# Patient Record
Sex: Male | Born: 1965 | Race: White | Hispanic: No | Marital: Single | Smoking: Former smoker
Health system: Southern US, Community
[De-identification: ages and names within clinical notes are randomized; demographics above are authoritative.]

## PROBLEM LIST (undated history)

## (undated) DIAGNOSIS — I10 Essential (primary) hypertension: Secondary | ICD-10-CM

## (undated) DIAGNOSIS — I219 Acute myocardial infarction, unspecified: Secondary | ICD-10-CM

## (undated) DIAGNOSIS — I2510 Atherosclerotic heart disease of native coronary artery without angina pectoris: Secondary | ICD-10-CM

## (undated) DIAGNOSIS — I509 Heart failure, unspecified: Secondary | ICD-10-CM

## (undated) DIAGNOSIS — E785 Hyperlipidemia, unspecified: Secondary | ICD-10-CM

## (undated) DIAGNOSIS — E669 Obesity, unspecified: Secondary | ICD-10-CM

## (undated) HISTORY — PX: CHOLECYSTECTOMY: SHX55

## (undated) HISTORY — PX: TONSILLECTOMY: SUR1361

## (undated) HISTORY — PX: CORONARY ARTERY BYPASS GRAFT: SHX141

---

## 2020-06-02 ENCOUNTER — Other Ambulatory Visit: Payer: Self-pay

## 2020-06-02 ENCOUNTER — Emergency Department: Payer: Self-pay

## 2020-06-02 ENCOUNTER — Encounter: Payer: Self-pay | Admitting: Emergency Medicine

## 2020-06-02 DIAGNOSIS — Y831 Surgical operation with implant of artificial internal device as the cause of abnormal reaction of the patient, or of later complication, without mention of misadventure at the time of the procedure: Secondary | ICD-10-CM | POA: Diagnosis present

## 2020-06-02 DIAGNOSIS — I445 Left posterior fascicular block: Secondary | ICD-10-CM | POA: Diagnosis present

## 2020-06-02 DIAGNOSIS — Z20822 Contact with and (suspected) exposure to covid-19: Secondary | ICD-10-CM | POA: Diagnosis present

## 2020-06-02 DIAGNOSIS — K2971 Gastritis, unspecified, with bleeding: Secondary | ICD-10-CM | POA: Diagnosis present

## 2020-06-02 DIAGNOSIS — Z8249 Family history of ischemic heart disease and other diseases of the circulatory system: Secondary | ICD-10-CM

## 2020-06-02 DIAGNOSIS — I252 Old myocardial infarction: Secondary | ICD-10-CM

## 2020-06-02 DIAGNOSIS — D509 Iron deficiency anemia, unspecified: Secondary | ICD-10-CM | POA: Diagnosis present

## 2020-06-02 DIAGNOSIS — Z87891 Personal history of nicotine dependence: Secondary | ICD-10-CM

## 2020-06-02 DIAGNOSIS — Z888 Allergy status to other drugs, medicaments and biological substances status: Secondary | ICD-10-CM

## 2020-06-02 DIAGNOSIS — K449 Diaphragmatic hernia without obstruction or gangrene: Secondary | ICD-10-CM | POA: Diagnosis present

## 2020-06-02 DIAGNOSIS — Z6838 Body mass index (BMI) 38.0-38.9, adult: Secondary | ICD-10-CM

## 2020-06-02 DIAGNOSIS — Z951 Presence of aortocoronary bypass graft: Secondary | ICD-10-CM

## 2020-06-02 DIAGNOSIS — E785 Hyperlipidemia, unspecified: Secondary | ICD-10-CM | POA: Diagnosis present

## 2020-06-02 DIAGNOSIS — K219 Gastro-esophageal reflux disease without esophagitis: Secondary | ICD-10-CM | POA: Diagnosis present

## 2020-06-02 DIAGNOSIS — I110 Hypertensive heart disease with heart failure: Secondary | ICD-10-CM | POA: Diagnosis present

## 2020-06-02 DIAGNOSIS — Z7982 Long term (current) use of aspirin: Secondary | ICD-10-CM

## 2020-06-02 DIAGNOSIS — I509 Heart failure, unspecified: Secondary | ICD-10-CM | POA: Diagnosis present

## 2020-06-02 DIAGNOSIS — E1165 Type 2 diabetes mellitus with hyperglycemia: Secondary | ICD-10-CM | POA: Diagnosis present

## 2020-06-02 DIAGNOSIS — K921 Melena: Secondary | ICD-10-CM | POA: Diagnosis present

## 2020-06-02 DIAGNOSIS — T82855A Stenosis of coronary artery stent, initial encounter: Secondary | ICD-10-CM | POA: Diagnosis present

## 2020-06-02 DIAGNOSIS — K279 Peptic ulcer, site unspecified, unspecified as acute or chronic, without hemorrhage or perforation: Secondary | ICD-10-CM | POA: Diagnosis present

## 2020-06-02 DIAGNOSIS — Z9114 Patient's other noncompliance with medication regimen: Secondary | ICD-10-CM

## 2020-06-02 DIAGNOSIS — I25110 Atherosclerotic heart disease of native coronary artery with unstable angina pectoris: Principal | ICD-10-CM | POA: Diagnosis present

## 2020-06-02 DIAGNOSIS — Z7902 Long term (current) use of antithrombotics/antiplatelets: Secondary | ICD-10-CM

## 2020-06-02 DIAGNOSIS — Z79899 Other long term (current) drug therapy: Secondary | ICD-10-CM

## 2020-06-02 LAB — CBC WITH DIFFERENTIAL/PLATELET
Abs Immature Granulocytes: 0.03 10*3/uL (ref 0.00–0.07)
Basophils Absolute: 0 10*3/uL (ref 0.0–0.1)
Basophils Relative: 0 %
Eosinophils Absolute: 0.3 10*3/uL (ref 0.0–0.5)
Eosinophils Relative: 3 %
HCT: 36.2 % — ABNORMAL LOW (ref 39.0–52.0)
Hemoglobin: 10.8 g/dL — ABNORMAL LOW (ref 13.0–17.0)
Immature Granulocytes: 0 %
Lymphocytes Relative: 27 %
Lymphs Abs: 2.3 10*3/uL (ref 0.7–4.0)
MCH: 23.6 pg — ABNORMAL LOW (ref 26.0–34.0)
MCHC: 29.8 g/dL — ABNORMAL LOW (ref 30.0–36.0)
MCV: 79 fL — ABNORMAL LOW (ref 80.0–100.0)
Monocytes Absolute: 0.8 10*3/uL (ref 0.1–1.0)
Monocytes Relative: 9 %
Neutro Abs: 5.1 10*3/uL (ref 1.7–7.7)
Neutrophils Relative %: 61 %
Platelets: 338 10*3/uL (ref 150–400)
RBC: 4.58 MIL/uL (ref 4.22–5.81)
RDW: 18 % — ABNORMAL HIGH (ref 11.5–15.5)
WBC: 8.4 10*3/uL (ref 4.0–10.5)
nRBC: 0 % (ref 0.0–0.2)

## 2020-06-02 NOTE — ED Triage Notes (Signed)
Patient ambulatory to triage with steady gait, without difficulty or distress noted; pt reports PTA began to have left sided CP radiating into jaw and left arm accomp by nausea

## 2020-06-03 ENCOUNTER — Encounter
Admission: EM | Disposition: A | Discharge status: Home / Self Care | Payer: Self-pay | Source: Home / Self Care | Attending: Internal Medicine

## 2020-06-03 ENCOUNTER — Inpatient Hospital Stay
Admission: EM | Admit: 2020-06-03 | Discharge: 2020-06-07 | DRG: 286 | Disposition: A | Discharge status: Home / Self Care | Payer: Self-pay | Attending: Internal Medicine | Admitting: Internal Medicine

## 2020-06-03 ENCOUNTER — Other Ambulatory Visit: Payer: Self-pay | Admitting: Cardiovascular Disease

## 2020-06-03 ENCOUNTER — Encounter: Payer: Self-pay | Admitting: Internal Medicine

## 2020-06-03 DIAGNOSIS — I25110 Atherosclerotic heart disease of native coronary artery with unstable angina pectoris: Principal | ICD-10-CM

## 2020-06-03 DIAGNOSIS — I200 Unstable angina: Secondary | ICD-10-CM

## 2020-06-03 DIAGNOSIS — I509 Heart failure, unspecified: Secondary | ICD-10-CM

## 2020-06-03 DIAGNOSIS — R079 Chest pain, unspecified: Secondary | ICD-10-CM | POA: Diagnosis present

## 2020-06-03 DIAGNOSIS — I10 Essential (primary) hypertension: Secondary | ICD-10-CM

## 2020-06-03 DIAGNOSIS — I2510 Atherosclerotic heart disease of native coronary artery without angina pectoris: Secondary | ICD-10-CM

## 2020-06-03 DIAGNOSIS — R072 Precordial pain: Secondary | ICD-10-CM

## 2020-06-03 HISTORY — DX: Acute myocardial infarction, unspecified: I21.9

## 2020-06-03 HISTORY — PX: THORACIC AORTOGRAM: CATH118269

## 2020-06-03 HISTORY — PX: LEFT HEART CATH AND CORS/GRAFTS ANGIOGRAPHY: CATH118250

## 2020-06-03 HISTORY — DX: Hyperlipidemia, unspecified: E78.5

## 2020-06-03 HISTORY — DX: Heart failure, unspecified: I50.9

## 2020-06-03 HISTORY — DX: Atherosclerotic heart disease of native coronary artery without angina pectoris: I25.10

## 2020-06-03 HISTORY — DX: Essential (primary) hypertension: I10

## 2020-06-03 HISTORY — DX: Obesity, unspecified: E66.9

## 2020-06-03 LAB — D-DIMER, QUANTITATIVE: D-Dimer, Quant: 1.2 ug/mL-FEU — ABNORMAL HIGH (ref 0.00–0.50)

## 2020-06-03 LAB — TROPONIN I (HIGH SENSITIVITY)
Troponin I (High Sensitivity): 15 ng/L (ref <18)
Troponin I (High Sensitivity): 17 ng/L (ref <18)
Troponin I (High Sensitivity): 11 ng/L (ref <18)

## 2020-06-03 LAB — COMPREHENSIVE METABOLIC PANEL
ALT: 13 U/L (ref 0–44)
AST: 17 U/L (ref 15–41)
Albumin: 3.5 g/dL (ref 3.5–5.0)
Alkaline Phosphatase: 73 U/L (ref 38–126)
Anion gap: 11 (ref 5–15)
BUN: 12 mg/dL (ref 6–20)
CO2: 22 mmol/L (ref 22–32)
Calcium: 8.6 mg/dL — ABNORMAL LOW (ref 8.9–10.3)
Chloride: 103 mmol/L (ref 98–111)
Creatinine, Ser: 0.9 mg/dL (ref 0.61–1.24)
GFR, Estimated: >60 mL/min (ref >60)
Glucose, Bld: 214 mg/dL — ABNORMAL HIGH (ref 70–99)
Potassium: 3.7 mmol/L (ref 3.5–5.1)
Sodium: 136 mmol/L (ref 135–145)
Total Bilirubin: 0.4 mg/dL (ref 0.3–1.2)
Total Protein: 7.1 g/dL (ref 6.5–8.1)

## 2020-06-03 LAB — SARS CORONAVIRUS 2 (TAT 6-24 HRS): SARS Coronavirus 2: NEGATIVE

## 2020-06-03 LAB — GLUCOSE, CAPILLARY: Glucose-Capillary: 179 mg/dL — ABNORMAL HIGH (ref 70–99)

## 2020-06-03 LAB — HIV ANTIBODY (ROUTINE TESTING W REFLEX): HIV Screen 4th Generation wRfx: NONREACTIVE

## 2020-06-03 LAB — HEMOGLOBIN A1C
Hgb A1c MFr Bld: 7.9 % — ABNORMAL HIGH (ref 4.8–5.6)
Mean Plasma Glucose: 180 mg/dL

## 2020-06-03 SURGERY — LEFT HEART CATH AND CORS/GRAFTS ANGIOGRAPHY
Anesthesia: Moderate Sedation

## 2020-06-03 MED ORDER — SODIUM CHLORIDE 0.9 % WEIGHT BASED INFUSION
1.0000 mL/kg/h | INTRAVENOUS | Status: DC
  Administered 2020-06-03: 1 mL/kg/h via INTRAVENOUS

## 2020-06-03 MED ORDER — CLOPIDOGREL BISULFATE 75 MG PO TABS
75.0000 mg | ORAL_TABLET | Freq: Every day | ORAL | Status: DC
  Administered 2020-06-03 – 2020-06-07 (×5): 75 mg via ORAL
  Filled 2020-06-03 (×5): qty 1

## 2020-06-03 MED ORDER — ENOXAPARIN SODIUM 60 MG/0.6ML IJ SOSY
0.5000 mg/kg | PREFILLED_SYRINGE | INTRAMUSCULAR | Status: DC
  Filled 2020-06-03: qty 1.2

## 2020-06-03 MED ORDER — HEPARIN (PORCINE) IN NACL 1000-0.9 UT/500ML-% IV SOLN
INTRAVENOUS | Status: AC
  Filled 2020-06-03: qty 1000

## 2020-06-03 MED ORDER — SODIUM CHLORIDE 0.9% FLUSH: 3.0000 mL | INTRAVENOUS | Status: DC | PRN

## 2020-06-03 MED ORDER — MIDAZOLAM HCL 2 MG/2ML IJ SOLN
INTRAMUSCULAR | Status: AC
  Filled 2020-06-03: qty 2

## 2020-06-03 MED ORDER — ASPIRIN EC 325 MG PO TBEC
325.0000 mg | DELAYED_RELEASE_TABLET | Freq: Every day | ORAL | Status: DC
  Administered 2020-06-03: 325 mg via ORAL
  Filled 2020-06-03: qty 1

## 2020-06-03 MED ORDER — NITROGLYCERIN 0.4 MG SL SUBL: 0.4000 mg | SUBLINGUAL_TABLET | SUBLINGUAL | Status: DC | PRN

## 2020-06-03 MED ORDER — FENTANYL CITRATE (PF) 100 MCG/2ML IJ SOLN
INTRAMUSCULAR | Status: DC | PRN
  Administered 2020-06-03 (×3): 25 ug via INTRAVENOUS

## 2020-06-03 MED ORDER — NITROGLYCERIN 2 % TD OINT
1.0000 [in_us] | TOPICAL_OINTMENT | Freq: Four times a day (QID) | TRANSDERMAL | Status: DC
  Administered 2020-06-03: 1 [in_us] via TOPICAL
  Filled 2020-06-03: qty 1

## 2020-06-03 MED ORDER — HYDRALAZINE HCL 20 MG/ML IJ SOLN: 10.0000 mg | INTRAMUSCULAR | Status: AC | PRN

## 2020-06-03 MED ORDER — FUROSEMIDE 10 MG/ML IJ SOLN
20.0000 mg | Freq: Once | INTRAMUSCULAR | Status: AC
  Administered 2020-06-03: 20 mg via INTRAVENOUS
  Filled 2020-06-03: qty 2

## 2020-06-03 MED ORDER — SODIUM CHLORIDE 0.9 % WEIGHT BASED INFUSION
3.0000 mL/kg/h | INTRAVENOUS | Status: DC
  Administered 2020-06-03: 3 mL/kg/h via INTRAVENOUS

## 2020-06-03 MED ORDER — VERAPAMIL HCL 2.5 MG/ML IV SOLN
INTRAVENOUS | Status: AC
  Filled 2020-06-03: qty 2

## 2020-06-03 MED ORDER — LIDOCAINE HCL (PF) 1 % IJ SOLN
INTRAMUSCULAR | Status: AC
  Filled 2020-06-03: qty 30

## 2020-06-03 MED ORDER — ENOXAPARIN SODIUM 60 MG/0.6ML IJ SOSY
0.5000 mg/kg | PREFILLED_SYRINGE | INTRAMUSCULAR | Status: DC
  Filled 2020-06-03 (×2): qty 0.63

## 2020-06-03 MED ORDER — MIDAZOLAM HCL 2 MG/2ML IJ SOLN
INTRAMUSCULAR | Status: DC | PRN
Start: 2020-06-03 — End: 2020-06-03
  Administered 2020-06-03: 2 mg via INTRAVENOUS
  Administered 2020-06-03: 1 mg via INTRAVENOUS

## 2020-06-03 MED ORDER — ASPIRIN 81 MG PO CHEW
81.0000 mg | CHEWABLE_TABLET | Freq: Every day | ORAL | Status: DC
  Administered 2020-06-04 – 2020-06-07 (×4): 81 mg via ORAL
  Filled 2020-06-03 (×4): qty 1

## 2020-06-03 MED ORDER — ENOXAPARIN SODIUM 60 MG/0.6ML IJ SOSY
0.5000 mg/kg | PREFILLED_SYRINGE | INTRAMUSCULAR | Status: DC
  Filled 2020-06-03: qty 0.63

## 2020-06-03 MED ORDER — SODIUM CHLORIDE 0.9 % IV SOLN: 250.0000 mL | INTRAVENOUS | Status: DC | PRN

## 2020-06-03 MED ORDER — ENOXAPARIN SODIUM 80 MG/0.8ML IJ SOSY
62.5000 mg | PREFILLED_SYRINGE | INTRAMUSCULAR | Status: DC
  Administered 2020-06-04: 62.5 mg via SUBCUTANEOUS
  Filled 2020-06-03: qty 0.63
  Filled 2020-06-03: qty 0.8

## 2020-06-03 MED ORDER — MORPHINE SULFATE (PF) 2 MG/ML IV SOLN
INTRAVENOUS | Status: AC
  Filled 2020-06-03: qty 1

## 2020-06-03 MED ORDER — ASPIRIN EC 81 MG PO TBEC: 81.0000 mg | DELAYED_RELEASE_TABLET | Freq: Every day | ORAL | Status: DC

## 2020-06-03 MED ORDER — IOHEXOL 300 MG/ML  SOLN
INTRAMUSCULAR | Status: DC | PRN
  Administered 2020-06-03: 126 mL

## 2020-06-03 MED ORDER — ACETAMINOPHEN 325 MG PO TABS: 650.0000 mg | ORAL_TABLET | ORAL | Status: DC | PRN

## 2020-06-03 MED ORDER — HEPARIN (PORCINE) IN NACL 2000-0.9 UNIT/L-% IV SOLN
INTRAVENOUS | Status: DC | PRN
  Administered 2020-06-03: 1000 mL

## 2020-06-03 MED ORDER — HEPARIN SODIUM (PORCINE) 1000 UNIT/ML IJ SOLN
INTRAMUSCULAR | Status: AC
  Filled 2020-06-03: qty 1

## 2020-06-03 MED ORDER — ASPIRIN 81 MG PO CHEW
324.0000 mg | CHEWABLE_TABLET | Freq: Once | ORAL | Status: AC
  Administered 2020-06-03: 324 mg via ORAL
  Filled 2020-06-03: qty 4

## 2020-06-03 MED ORDER — MORPHINE SULFATE (PF) 2 MG/ML IV SOLN
2.0000 mg | INTRAVENOUS | Status: DC | PRN
Start: 2020-06-03 — End: 2020-06-05
  Administered 2020-06-03 – 2020-06-05 (×15): 2 mg via INTRAVENOUS
  Filled 2020-06-03 (×15): qty 1

## 2020-06-03 MED ORDER — SODIUM CHLORIDE 0.9% FLUSH
3.0000 mL | Freq: Two times a day (BID) | INTRAVENOUS | Status: DC
  Administered 2020-06-03 – 2020-06-06 (×7): 3 mL via INTRAVENOUS

## 2020-06-03 MED ORDER — INSULIN ASPART 100 UNIT/ML IJ SOLN: 0.0000 [IU] | Freq: Every day | INTRAMUSCULAR | Status: DC

## 2020-06-03 MED ORDER — INSULIN ASPART 100 UNIT/ML IJ SOLN
0.0000 [IU] | Freq: Three times a day (TID) | INTRAMUSCULAR | Status: DC
  Administered 2020-06-04: 5 [IU] via SUBCUTANEOUS
  Administered 2020-06-04: 3 [IU] via SUBCUTANEOUS
  Administered 2020-06-05: 5 [IU] via SUBCUTANEOUS
  Administered 2020-06-05 (×2): 3 [IU] via SUBCUTANEOUS
  Administered 2020-06-06 (×3): 2 [IU] via SUBCUTANEOUS
  Administered 2020-06-07: 3 [IU] via SUBCUTANEOUS
  Administered 2020-06-07: 2 [IU] via SUBCUTANEOUS
  Filled 2020-06-03 (×10): qty 1

## 2020-06-03 MED ORDER — METOPROLOL TARTRATE 25 MG PO TABS: 25.0000 mg | ORAL_TABLET | Freq: Two times a day (BID) | ORAL | Status: DC

## 2020-06-03 MED ORDER — NITROGLYCERIN 2 % TD OINT
1.0000 [in_us] | TOPICAL_OINTMENT | Freq: Once | TRANSDERMAL | Status: AC
  Administered 2020-06-03: 1 [in_us] via TOPICAL
  Filled 2020-06-03: qty 1

## 2020-06-03 MED ORDER — LIDOCAINE HCL (PF) 1 % IJ SOLN
INTRAMUSCULAR | Status: DC | PRN
  Administered 2020-06-03: 10 mL

## 2020-06-03 MED ORDER — FENTANYL CITRATE (PF) 100 MCG/2ML IJ SOLN
INTRAMUSCULAR | Status: AC
  Filled 2020-06-03: qty 2

## 2020-06-03 MED ORDER — SODIUM CHLORIDE 0.9% FLUSH
3.0000 mL | Freq: Two times a day (BID) | INTRAVENOUS | Status: DC
  Administered 2020-06-03 – 2020-06-05 (×6): 3 mL via INTRAVENOUS

## 2020-06-03 MED ORDER — MORPHINE SULFATE (PF) 4 MG/ML IV SOLN
4.0000 mg | Freq: Once | INTRAVENOUS | Status: AC
Start: 2020-06-03 — End: 2020-06-03
  Administered 2020-06-03: 4 mg via INTRAVENOUS
  Filled 2020-06-03: qty 1

## 2020-06-03 MED ORDER — LABETALOL HCL 5 MG/ML IV SOLN: 10.0000 mg | INTRAVENOUS | Status: AC | PRN

## 2020-06-03 MED ORDER — ONDANSETRON HCL 4 MG/2ML IJ SOLN
4.0000 mg | Freq: Four times a day (QID) | INTRAMUSCULAR | Status: DC | PRN
  Administered 2020-06-04 – 2020-06-07 (×3): 4 mg via INTRAVENOUS
  Filled 2020-06-03 (×3): qty 2

## 2020-06-03 MED ORDER — LISINOPRIL 10 MG PO TABS
10.0000 mg | ORAL_TABLET | Freq: Every day | ORAL | Status: DC
  Administered 2020-06-04: 10 mg via ORAL
  Filled 2020-06-03: qty 1

## 2020-06-03 MED ORDER — ATORVASTATIN CALCIUM 20 MG PO TABS: 40.0000 mg | ORAL_TABLET | Freq: Every day | ORAL | Status: DC

## 2020-06-03 MED ORDER — PANTOPRAZOLE SODIUM 40 MG PO TBEC
40.0000 mg | DELAYED_RELEASE_TABLET | Freq: Every day | ORAL | Status: DC
  Administered 2020-06-03 – 2020-06-04 (×2): 40 mg via ORAL
  Filled 2020-06-03 (×2): qty 1

## 2020-06-03 MED ORDER — RANOLAZINE ER 500 MG PO TB12
1000.0000 mg | ORAL_TABLET | Freq: Two times a day (BID) | ORAL | Status: DC
  Administered 2020-06-03 – 2020-06-07 (×9): 1000 mg via ORAL
  Filled 2020-06-03 (×9): qty 2

## 2020-06-03 MED ORDER — METOPROLOL SUCCINATE ER 25 MG PO TB24
25.0000 mg | ORAL_TABLET | Freq: Two times a day (BID) | ORAL | Status: DC
  Administered 2020-06-03 – 2020-06-07 (×9): 25 mg via ORAL
  Filled 2020-06-03 (×9): qty 1

## 2020-06-03 MED ORDER — ONDANSETRON HCL 4 MG/2ML IJ SOLN
4.0000 mg | Freq: Once | INTRAMUSCULAR | Status: AC
  Administered 2020-06-03: 4 mg via INTRAVENOUS
  Filled 2020-06-03: qty 2

## 2020-06-03 MED ORDER — RANOLAZINE ER 500 MG PO TB12
500.0000 mg | ORAL_TABLET | Freq: Two times a day (BID) | ORAL | Status: DC
  Administered 2020-06-03: 500 mg via ORAL
  Filled 2020-06-03 (×2): qty 1

## 2020-06-03 SURGICAL SUPPLY — 18 items
CANNULA 5F STIFF (CANNULA) ×1 IMPLANT
CATH INFINITI 5 FR IM (CATHETERS) ×1 IMPLANT
CATH INFINITI 5FR AL1 (CATHETERS) ×1 IMPLANT
CATH INFINITI 5FR ANG PIGTAIL (CATHETERS) ×1 IMPLANT
CATH INFINITI 5FR JL4 (CATHETERS) ×1 IMPLANT
DEVICE CLOSURE MYNXGRIP 5F (Vascular Products) ×1 IMPLANT
DRAPE BRACHIAL (DRAPES) ×1 IMPLANT
GLIDESHEATH SLEND A-KIT 6F 22G (SHEATH) ×1 IMPLANT
GUIDEWIRE INQWIRE 1.5J.035X260 (WIRE) IMPLANT
INQWIRE 1.5J .035X260CM (WIRE) ×2
PACK CARDIAC CATH (CUSTOM PROCEDURE TRAY) ×2 IMPLANT
PANNUS RETENTION SYSTEM 2 PAD (MISCELLANEOUS) ×1 IMPLANT
PROTECTION STATION PRESSURIZED (MISCELLANEOUS) ×2
SET ATX SIMPLICITY (MISCELLANEOUS) ×1 IMPLANT
SHEATH AVANTI 5FR X 11CM (SHEATH) ×1 IMPLANT
STATION PROTECTION PRESSURIZED (MISCELLANEOUS) IMPLANT
WIRE AMPLATZ SS-J .035X180CM (WIRE) ×1 IMPLANT
WIRE GUIDERIGHT .035X150 (WIRE) ×1 IMPLANT

## 2020-06-03 NOTE — ED Provider Notes (Signed)
[facility] Emergency Department Provider Note   ____________________________________________   Event Date/Time   First MD Initiated Contact with Patient 06/03/20 [phone]     (approximate)  I have reviewed the triage vital signs and the nursing notes.   HISTORY  Chief Complaint Chest Pain    HPI Richard Malone is a 55 y.o. male who presents to the ED with a chief complaint of chest pain.  Patient has a history of CAD status post CABG, 16 stents.  Began to experience left-sided chest pressure approximately 10 PM after being picked up from the airport from flight from [place].  Took nitroglycerin without relief in symptoms.  Denies associated diaphoresis, shortness of breath, vomiting or dizziness.  Endorses nausea and pain radiating into his left jaw and arm.  Denies abdominal pain, dysuria or diarrhea.  States feels similarly to his anginal pain.     Past Medical History:  Diagnosis Date  . CHF (congestive heart failure) (HCC)   . MI (myocardial infarction) [facility])     Patient Active Problem List   Diagnosis Date Noted  . Chest pain 06/03/2020  . CAD (coronary artery disease) 06/03/2020  . CHF (congestive heart failure) (HCC) 06/03/2020    Past Surgical History:  Procedure Laterality Date  . CHOLECYSTECTOMY    . TONSILLECTOMY      Prior to Admission medications   Not on File    Allergies Patient has no known allergies.  History reviewed. No pertinent family history.  Social History Social History   Tobacco Use  . Smoking status: Never Smoker  . Smokeless tobacco: Never Used  Vaping Use  . Vaping Use: Never used  Substance Use Topics  . Alcohol use: Not Currently    Review of Systems  Constitutional: No fever/chills Eyes: No visual changes. ENT: No sore throat. Cardiovascular: Positive for chest pain. Respiratory: Denies shortness of breath. Gastrointestinal: No abdominal pain.  Positive for nausea, no vomiting.  No diarrhea.   No constipation. Genitourinary: Negative for dysuria. Musculoskeletal: Negative for back pain. Skin: Negative for rash. Neurological: Negative for headaches, focal weakness or numbness.   ____________________________________________   PHYSICAL EXAM:  VITAL SIGNS: ED Triage Vitals  Enc Vitals Group     BP 06/02/20 2238 (!) 166/106     Pulse Rate 06/02/20 2238 69     Resp 06/02/20 2238 18     Temp 06/02/20 2238 98.1 F (36.7 C)     Temp Source 06/02/20 2238 Oral     SpO2 06/02/20 2238 100 %     Weight 06/02/20 2237 270 lb (122.5 kg)     Height 06/02/20 2237 5\' 10"  (1.778 m)     Head Circumference --      Peak Flow --      Pain Score 06/02/20 2237 8     Pain Loc --      Pain Edu? --      Excl. in GC? --     Constitutional: Alert and oriented. Well appearing and in mild acute distress. Eyes: Conjunctivae are normal. PERRL. EOMI. Head: Atraumatic. Nose: No congestion/rhinnorhea. Mouth/Throat: Mucous membranes are moist.   Neck: No stridor.   Cardiovascular: Normal rate, regular rhythm. Grossly normal heart sounds.  Good peripheral circulation. Respiratory: Normal respiratory effort.  No retractions. Lungs CTAB. Gastrointestinal: Soft and nontender to light or deep palpation. No distention. No abdominal bruits. No CVA tenderness. Musculoskeletal: No lower extremity tenderness nor edema.  No joint effusions. Neurologic:  Normal speech and language. No  gross focal neurologic deficits are appreciated. No gait instability. Skin:  Skin is warm, dry and intact. No rash noted. Psychiatric: Mood and affect are normal. Speech and behavior are normal.  ____________________________________________   LABS (all labs ordered are listed, but only abnormal results are displayed)  Labs Reviewed  CBC WITH DIFFERENTIAL/PLATELET - Abnormal; Notable for the following components:      Result Value   Hemoglobin 10.8 (*)    HCT 36.2 (*)    MCV 79.0 (*)    MCH 23.6 (*)    MCHC 29.8 (*)     RDW 18.0 (*)    All other components within normal limits  COMPREHENSIVE METABOLIC PANEL - Abnormal; Notable for the following components:   Glucose, Bld 214 (*)    Calcium 8.6 (*)    All other components within normal limits  HIV ANTIBODY (ROUTINE TESTING W REFLEX)  CBC  CREATININE, SERUM  TROPONIN I (HIGH SENSITIVITY)  TROPONIN I (HIGH SENSITIVITY)   ____________________________________________  EKG  ED ECG REPORT I, Bridgitte Felicetti J, the attending physician, personally viewed and interpreted this ECG.   Date: 06/03/2020  EKG Time: 2239  Rate: 71  Rhythm: normal EKG, normal sinus rhythm  Axis: Normal  Intervals:none  ST&T Change: Nonspecific  ED ECG REPORT I, Jenascia Bumpass J, the attending physician, personally viewed and interpreted this ECG.   Date: 06/03/2020  EKG Time: 0420  Rate: 71  Rhythm: normal EKG, normal sinus rhythm  Axis: Normal  Intervals:none  ST&T Change: Nonspecific   ____________________________________________  RADIOLOGY I, Gracen Ringwald J, personally viewed and evaluated these images (plain radiographs) as part of my medical decision making, as well as reviewing the written report by the radiologist.  ED MD interpretation: No acute cardiopulmonary process  Official radiology report(s): DG Chest 2 View  Result Date: 06/02/2020 CLINICAL DATA:  55 year old male with chest pain. EXAM: CHEST - 2 VIEW COMPARISON:  None. FINDINGS: No focal consolidation, pleural effusion, or pneumothorax. Mild cardiomegaly. Median sternotomy wires and coronary stent. No acute osseous pathology. Several metallic pellets noted over the right shoulder. IMPRESSION: No acute cardiopulmonary process. Electronically Signed   By: Elgie Collard M.D.   On: 06/02/2020 22:58    ____________________________________________   PROCEDURES  Procedure(s) performed (including Critical Care):  .1-3 Lead EKG Interpretation Performed by: Irean Hong, MD Authorized by: Irean Hong, MD      Interpretation: normal     ECG rate:  75   ECG rate assessment: normal     Rhythm: sinus rhythm     Ectopy: none     Conduction: normal   Comments:     Patient placed on cardiac monitor to evaluate for arrhythmias    CRITICAL CARE Performed by: Irean Hong   Total critical care time: 30 minutes  Critical care time was exclusive of separately billable procedures and treating other patients.  Critical care was necessary to treat or prevent imminent or life-threatening deterioration.  Critical care was time spent personally by me on the following activities: development of treatment plan with patient and/or surrogate as well as nursing, discussions with consultants, evaluation of patient's response to treatment, examination of patient, obtaining history from patient or surrogate, ordering and performing treatments and interventions, ordering and review of laboratory studies, ordering and review of radiographic studies, pulse oximetry and re-evaluation of patient's condition. ____________________________________________   INITIAL IMPRESSION / ASSESSMENT AND PLAN / ED COURSE  As part of my medical decision making, I reviewed the following data within the electronic  MEDICAL RECORD NUMBER Nursing notes reviewed and incorporated, Labs reviewed, EKG interpreted, Old chart reviewed (none available), Radiograph reviewed, Discussed with admitting physician and Notes from prior ED visits     55 year old male with extensive history of CAD presenting with chest pain. Differential diagnosis includes, but is not limited to, ACS, aortic dissection, pulmonary embolism, cardiac tamponade, pneumothorax, pneumonia, pericarditis, myocarditis, GI-related causes including esophagitis/gastritis, and musculoskeletal chest wall pain.    Despite 2 sets of negative troponins, patient persists with active chest pain.  Repeat EKG unremarkable.  Will initiate treatment with aspirin, morphine, nitroglycerin paste.   Will discuss with hospital services for admission for unstable angina and hypertension.      ____________________________________________   FINAL CLINICAL IMPRESSION(S) / ED DIAGNOSES  Final diagnoses:  Unstable angina (HCC)  Hypertension, unspecified type     ED Discharge Orders    None      *Please note:  Keaghan Staton was evaluated in Emergency Department on 06/03/2020 for the symptoms described in the history of present illness. He was evaluated in the context of the global COVID-19 pandemic, which necessitated consideration that the patient might be at risk for infection with the SARS-CoV-2 virus that causes COVID-19. Institutional protocols and algorithms that pertain to the evaluation of patients at risk for COVID-19 are in a state of rapid change based on information released by regulatory bodies including the CDC and federal and state organizations. These policies and algorithms were followed during the patient's care in the ED.  Some ED evaluations and interventions may be delayed as a result of limited staffing during and the pandemic.*   Note:  This document was prepared using Dragon voice recognition software and may include unintentional dictation errors.   Irean Hong, MD 06/03/20 [phone]

## 2020-06-03 NOTE — H&P (View-Only) (Signed)
Cardiology Consult    Patient ID: Richard Malone MRN: [id number], DOB/AGE: 1965-10-11   Admit date: 06/03/2020 Date of Consult: 06/03/2020  Primary Physician: System, Provider Not In Primary Cardiologist: None - lives  Requesting Provider: Kerry Hough, MD.  Patient Profile    Richard Malone is a 55 y.o. male with a history of obesity, MI, HTN, CAD, and hyperlipidemia, who is being seen today for the evaluation of chest pain at the request of Dr. Kerry Malone.  Past Medical History   Past Medical History:  Diagnosis Date  . CAD (coronary artery disease)    a. s/p first MI @ age 31; b. 2004 s/p CABG @ [facility]; c. reports h/o 16 stents; d. last cath ~ 4 yrs ago in [place].  Marland Kitchen HTN (hypertension)   . Hyperlipidemia LDL goal <70    a. Statin/PCSK9i intolerant. Does not want to try alternate agents.  . MI (myocardial infarction) (HCC)   . Obesity     Past Surgical History:  Procedure Laterality Date  . CHOLECYSTECTOMY    . CORONARY ARTERY BYPASS GRAFT    . TONSILLECTOMY       Allergies  Allergies  Allergen Reactions  . Drug Ingredient [Atorvastatin]     History of Present Illness    55 year old male with a prior history of obesity, hyperlipidemia, HTN, MI and CAD. He has a significant family history of premature CAD, as well.  His first MI occurred at age 77.  He underwent CABG in ~ 2004 and has had a total of 16 stents placed.  He thinks that his last cath was about 4 yrs ago, "maybe somewhere in [place]."  He works as a Control and instrumentation engineer and has not been able to establish a cardiologist to routinely follow up as he travels around the country often due to his job.  At baseline, he does not typically experience chest pain or dyspnea.  He is relatively sedentary.  He does carry sl NTG w/ him but rarely uses - last dose 10-12 months ago.  He was in his USOH on 5/25.  He traveled from [place] to [place], [place] via plane and then drove from [place] to [place] w/ his girlfriend, to visit friends.   He says that they stopped and walked around frequently.  About 20 mins outside of [place], he became diaphoretic and began to experience chest pain/tightness, radiating to the jaw and left arm with dyspnea, nausea and vomiting. Patient states he has experienced these symptoms before with previous MIs.  He took sl ntg en route w/o relief, prompting him to present to the ED.  Here, symptoms eventually resolved following asa, morphine, and ntp, though total duration of symptoms was ~ 6 hrs.  Despite prolonged symptoms, ECG is w/o acute ST/T changes and troponins have remained normal.  He did have recurrent c/p earlier this AM, which quickly resolved after additional doses of morphine.  Inpatient Medications    . aspirin EC  325 mg Oral Daily  . clopidogrel  75 mg Oral Daily  . enoxaparin (LOVENOX) injection  0.5 mg/kg Subcutaneous Q24H  . insulin aspart  0-15 Units Subcutaneous TID WC  . insulin aspart  0-5 Units Subcutaneous QHS  . lisinopril  10 mg Oral Daily  . metoprolol succinate  25 mg Oral BID  . nitroGLYCERIN  1 inch Topical Q6H WA  . pantoprazole  40 mg Oral Daily  . ranolazine  500 mg Oral BID  . sodium chloride flush  3 mL Intravenous Q12H  Family History    Family History  Problem Relation Age of Onset  . CAD Mother        premature CAD  . CAD Father        premature CAD  . CAD Sister   . Cancer Sister    He indicated that his mother is deceased. He indicated that his father is deceased. He indicated that his sister is alive. He indicated that his maternal grandmother is deceased. He indicated that his maternal grandfather is deceased. He indicated that his paternal grandmother is deceased. He indicated that his paternal grandfather is deceased.   Social History    Social History   Socioeconomic History  . Marital status: Single    Spouse name: Not on file  . Number of children: Not on file  . Years of education: Not on file  . Highest education level: Not on  file  Occupational History  . Not on file  Tobacco Use  . Smoking status: Former Smoker    Packs/day: 1.00    Years: 15.00    Pack years: 15.00    Types: Cigarettes  . Smokeless tobacco: Never Used  Vaping Use  . Vaping Use: Never used  Substance and Sexual Activity  . Alcohol use: Not Currently  . Drug use: Never  . Sexual activity: Not on file  Other Topics Concern  . 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  Usually spends his [place] in [place].   Social Determinants of Health   Financial Resource Strain: Not on file  Food Insecurity: Not on file  Transportation Needs: Not on file  Physical Activity: Not on file  Stress: Not on file  Social Connections: Not on file  Intimate Partner Violence: Not on file     Review of Systems    General:  No chills, fever, night sweats or weight changes.  Cardiovascular:  +++ 5/10 chest pain, +++ dyspnea last night, no edema, orthopnea, palpitations, paroxysmal nocturnal dyspnea. Dermatological: No rash, lesions/masses Respiratory: No cough, +++ dyspnea w/ chest pain last night. Urologic: No hematuria, dysuria Abdominal:   +++ nausea and vomiting prior to presentation, no diarrhea, bright red blood per rectum, melena, or hematemesis. Neurologic:  No visual changes, wkns, changes in mental status. All other systems reviewed and are otherwise negative except as noted above.  Physical Exam    Blood pressure 119/75, pulse 74, temperature 98.1 F (36.7 C), temperature source Oral, resp. rate 20, height 5\' 10"  (1.778 m), weight 122.5 kg, SpO2 95 %.  General: Pleasant, NAD Psych: Normal affect. Neuro: Alert and oriented X 3. Moves all extremities spontaneously. HEENT: Normal  Neck: Supple, obese - difficult to gauge JVP.  No bruits. Lungs:  Resp regular and unlabored, CTA. Heart:  RRR no s3, s4, or murmurs.  Abdomen: Obese, non-tender, BS + x 4.  Extremities: No clubbing, cyanosis or edema. DP/PT2+, Radials 2+ and equal bilaterally.   Labs    Cardiac Enzymes Recent Labs  Lab 06/02/20 2242 06/03/20 0137 06/03/20 0954  TROPONINIHS 15 17 11       Lab Results  Component Value Date   WBC 8.4 06/02/2020   HGB 10.8 (L) 06/02/2020   HCT 36.2 (L) 06/02/2020   MCV 79.0 (L) 06/02/2020   PLT 338 06/02/2020    Recent Labs  Lab 06/02/20 2242  NA 136  K 3.7  CL 103  CO2 22  BUN 12  CREATININE 0.90  CALCIUM 8.6*  PROT 7.1  BILITOT 0.4  ALKPHOS 73  ALT 13  AST 17  GLUCOSE 214*   Lab Results  Component Value Date   DDIMER 1.20 (H) 06/03/2020     Radiology Studies    DG Chest 2 View  Result Date: 06/02/2020 CLINICAL DATA:  55 year old male with chest pain. EXAM: CHEST - 2 VIEW COMPARISON:  None. FINDINGS: No focal consolidation, pleural effusion, or pneumothorax. Mild cardiomegaly. Median sternotomy wires and coronary stent. No acute osseous pathology. Several metallic pellets noted over the right shoulder. IMPRESSION: No acute cardiopulmonary process. Electronically Signed   By: Elgie Collard M.D.   On: 06/02/2020 22:58    ECG & Cardiac Imaging    SR @ 72 RAD, LPFB, LAE, ? Prior anterior infarct - personally reviewed.  Assessment & Plan    1. Unstable Angina/CAD: 55 y/o w/ a h/o premature CAD s/p first MI @ 50, CABG in 2004, and a reported total of 17 stents in his lifetime.  Last cath ~ 4 yrs ago, possibly in [place]. He doesn't have any stent cards w/ him.  He currently lives in [place], but travels significantly for work, and does not have a regular cardiologist.  He presented 5/25 w/ chest pain, dyspnea, n, and vomiting, after driving for ~ 5 hrs.  He noted that pain was similar to prior angina.  Here, High-sensitivity troponins have been nl despite prolonged symptoms, and EKG does not show any acute ST/T changes (no old EKGs for comparison).  Pain  initially resolved after ~ 6.5 hrs w/ asa, ntp, and morphine, though he has had some recurrence requiring additional doses of morphine.  Pt looked comfortable when interviewed.  Given patient's symptoms, history, and high pretest probability for recurrent obstructive coronary disease, we will plan on cath today.  The patient understands that risks include but are not limited to stroke (1 in 1000), death (1 in 1000), kidney failure [usually temporary] (1 in 500), bleeding (1 in 200), allergic reaction [possibly serious] (1 in 200), and agrees to proceed.  Cont asa, plavix,  blocker, and ranexa.  He is intolerant to statins and also reports myalgias w/ PCSK9i in the past.  He is not willing to try zetia or bempedoic acid.  If cath unrevealing, would have a low threshold to r/o PE given prolonged travel and elevated d dimer.  He is not hypoxic/tachycardic, and denies leg pain.  No swelling noted on exam.  2. Essential HTN: stable on  blocker and acei.  3.  HL:  Statin intolerant.  Says he had myalgias w/ PCSK9i.  Doesn't want to try zeta/bempedoic acid.  Defer mgmt to outpt care team in [facility].  Might be a good candidate for inclisiran.    4.  Microcytic anemia:  Pt reports h/o ulcers dating back to his teens.  He has been on asa/plavix chronically.  Follow and consider GI eval if cath unremarkable.  Cont PPI.  Signed, Nicolasa Ducking, NP 06/03/2020, 12:31 PM  For questions or updates, please contact   Please consult [email or web address] for contact info under Cardiology/STEMI.

## 2020-06-03 NOTE — Interval H&P Note (Signed)
History and Physical Interval Note:  06/03/2020 3:35 PM  Richard Malone  has presented today for surgery, with the diagnosis of unstable angina.  The various methods of treatment have been discussed with the patient and family. After consideration of risks, benefits and other options for treatment, the patient has consented to  Procedure(s): LEFT HEART CATH AND CORS/GRAFTS ANGIOGRAPHY (N/A) as a surgical intervention.  The patient's history has been reviewed, patient examined, no change in status, stable for surgery.  I have reviewed the patient's chart and labs.  Questions were answered to the patient's satisfaction.    Cath Lab Visit (complete for each Cath Lab visit)  Clinical Evaluation Leading to the Procedure:   ACS: Yes.    Non-ACS:  N/A  Richard Malone

## 2020-06-03 NOTE — ED Notes (Signed)
Lab at bedside

## 2020-06-03 NOTE — ED Notes (Addendum)
Informed MD Memon that upon this RN assuming care, this RN noticed that pt had eaten breakfast tray this AM. This RN noted NPO orders in place at this time so MD was made aware. Pt also refusing CBG check at this time. MD also made aware.   Per MD Memon, aware of pt eating, was before cardiology saw pt and is now NPO.

## 2020-06-03 NOTE — Progress Notes (Signed)
Patient admitted to the hospital earlier this morning by Dr. Para March  Patient seen and examined.  Currently is not having any chest pain.  Feels that the nitroglycerin has helped with this.  He was previously having pain that was rating into his jaw and his left arm.  Felt it was similar to prior cardiac pain that he has had in the past.  Cardiac enzymes have been negative.  EKG did not show any acute changes.  He does have a prior history of coronary artery disease with stenting and CABG in the past.  He is chronically on aspirin, Plavix, Ranexa, beta-blocker.  All these medications have been continued.  We will continue Nitropaste for now.  Appreciate cardiology assistance.  Plan is for cardiac catheterization as schedule permits.  D-dimer is also been ordered.  He was noted to be hyperglycemic on arrival with a blood sugar of 214.  A1c has been ordered.  He does not appear to be on any chronic diabetic medications.  We will continue to follow blood sugars.  He does have elevated blood pressure and is currently on metoprolol and lisinopril.  These have been continued.  [employer]

## 2020-06-03 NOTE — Progress Notes (Signed)
Anticoagulation monitoring(Lovenox):  55 yo male ordered Lovenox 40 mg Q24h  Filed Weights   06/02/20 2237  Weight: 122.5 kg (270 lb)   BMI 38.74    Lab Results  Component Value Date   CREATININE 0.90 06/02/2020   Estimated Creatinine Clearance: 124.6 mL/min (by C-G formula based on SCr of 0.9 mg/dL). Hemoglobin & Hematocrit     Component Value Date/Time   HGB 10.8 (L) 06/02/2020 2242   HCT 36.2 (L) 06/02/2020 2242     Per Protocol for Patient with estCrcl > 30 ml/min and BMI > 30, will transition to Lovenox 62.5 mg Q24h.

## 2020-06-03 NOTE — ED Notes (Signed)
Lab requested to recollect d-dimer and HIV orders.

## 2020-06-03 NOTE — H&P (Signed)
History and Physical    Richard Malone YOV:[id number] DOB: 1965-06-12 DOA: 06/03/2020  PCP: No primary care provider on file.   Patient coming from: Home  I have personally briefly reviewed patient's old medical records in [facility]  Chief Complaint: Chest pain  HPI: Richard Malone is a 55 y.o. male from [place] town with medical history significant for  hx of multiple MI s/p CABG and with multiple stents as well as CHF who presents to the ED with left-sided chest pain radiating to jaw and left arm associated with diaphoresis, nausea and vomiting, similar to prior MIs. Pain is severe, described as tightness, onset while at rest.  Denies palpitations, lightheadedness or shortness of breath and denies cough, fever, chills, abdominal pain or change in bowel habits ED course: On arrival, afebrile, BP 166/106, pulse 69 respirations 18 with O2 sat 100% on room air.  Troponin 15-17.  Blood sugar 214, hemoglobin 10.8.  Labs otherwise unremarkable EKG, personally viewed and interpreted: Normal sinus rhythm at 71 with left posterior fascicular block and otherwise nonacute findings Chest x-ray: No acute process  Patient treated with aspirin, Nitropaste, Zofran and morphine.  Hospitalist consulted for admission.  Review of Systems: As per HPI otherwise all other systems on review of systems negative.    Past Medical History:  Diagnosis Date  . CHF (congestive heart failure) (HCC)   . HTN (hypertension)   . MI (myocardial infarction) [facility])     Past Surgical History:  Procedure Laterality Date  . CHOLECYSTECTOMY    . CORONARY ARTERY BYPASS GRAFT    . TONSILLECTOMY       reports that he has quit smoking. He has never used smokeless tobacco. He reports previous alcohol use. No history on file for drug use.  No Known Allergies  Family History  Problem Relation Age of Onset  . CAD Mother   . CAD Father       Prior to Admission medications   Not on File    Physical Exam: Vitals:    06/02/20 2237 06/02/20 2238 06/03/20 0128 06/03/20 0400  BP:  (!) 166/106 (!) 163/93 (!) 157/110  Pulse:  69 70 71  Resp:  18 18 16   Temp:  98.1 F (36.7 C) 98.1 F (36.7 C)   TempSrc:  Oral Oral   SpO2:  100% 100% 99%  Weight: 122.5 kg     Height: 5\' 10"  (1.778 m)        Vitals:   06/02/20 2237 06/02/20 2238 06/03/20 0128 06/03/20 0400  BP:  (!) 166/106 (!) 163/93 (!) 157/110  Pulse:  69 70 71  Resp:  18 18 16   Temp:  98.1 F (36.7 C) 98.1 F (36.7 C)   TempSrc:  Oral Oral   SpO2:  100% 100% 99%  Weight: 122.5 kg     Height: 5\' 10"  (1.778 m)         Constitutional: Alert and oriented x 3 . Not in any apparent distress HEENT:      Head: Normocephalic and atraumatic.         Eyes: PERLA, EOMI, Conjunctivae are normal. Sclera is non-icteric.       Mouth/Throat: Mucous membranes are moist.       Neck: Supple with no signs of meningismus. Cardiovascular: Regular rate and rhythm. No murmurs, gallops, or rubs. 2+ symmetrical distal pulses are present . No JVD. No LE edema Respiratory: Respiratory effort normal .Lungs sounds clear bilaterally. No wheezes, crackles, or rhonchi.  Gastrointestinal:  Soft, non tender, and non distended with positive bowel sounds.  Genitourinary: No CVA tenderness. Musculoskeletal: Nontender with normal range of motion in all extremities. No cyanosis, or erythema of extremities. Neurologic:  Face is symmetric. Moving all extremities. No gross focal neurologic deficits . Skin: Skin is warm, dry.  No rash or ulcers Psychiatric: Mood and affect are normal    Labs on Admission: I have personally reviewed following labs and imaging studies  CBC: Recent Labs  Lab 06/02/20 2242  WBC 8.4  NEUTROABS 5.1  HGB 10.8*  HCT 36.2*  MCV 79.0*  PLT 338   Basic Metabolic Panel: Recent Labs  Lab 06/02/20 2242  NA 136  K 3.7  CL 103  CO2 22  GLUCOSE 214*  BUN 12  CREATININE 0.90  CALCIUM 8.6*   GFR: Estimated Creatinine Clearance: 124.6  mL/min (by C-G formula based on SCr of 0.9 mg/dL). Liver Function Tests: Recent Labs  Lab 06/02/20 2242  AST 17  ALT 13  ALKPHOS 73  BILITOT 0.4  PROT 7.1  ALBUMIN 3.5   No results for input(s): LIPASE, AMYLASE in the last 168 hours. No results for input(s): AMMONIA in the last 168 hours. Coagulation Profile: No results for input(s): INR, PROTIME in the last 168 hours. Cardiac Enzymes: No results for input(s): CKTOTAL, CKMB, CKMBINDEX, TROPONINI in the last 168 hours. BNP (last 3 results) No results for input(s): PROBNP in the last 8760 hours. HbA1C: No results for input(s): HGBA1C in the last 72 hours. CBG: No results for input(s): GLUCAP in the last 168 hours. Lipid Profile: No results for input(s): CHOL, HDL, LDLCALC, TRIG, CHOLHDL, LDLDIRECT in the last 72 hours. Thyroid Function Tests: No results for input(s): TSH, T4TOTAL, FREET4, T3FREE, THYROIDAB in the last 72 hours. Anemia Panel: No results for input(s): VITAMINB12, FOLATE, FERRITIN, TIBC, IRON, RETICCTPCT in the last 72 hours. Urine analysis: No results found for: COLORURINE, APPEARANCEUR, LABSPEC, PHURINE, GLUCOSEU, HGBUR, BILIRUBINUR, KETONESUR, PROTEINUR, UROBILINOGEN, NITRITE, LEUKOCYTESUR  Radiological Exams on Admission: DG Chest 2 View  Result Date: 06/02/2020 CLINICAL DATA:  55 year old male with chest pain. EXAM: CHEST - 2 VIEW COMPARISON:  None. FINDINGS: No focal consolidation, pleural effusion, or pneumothorax. Mild cardiomegaly. Median sternotomy wires and coronary stent. No acute osseous pathology. Several metallic pellets noted over the right shoulder. IMPRESSION: No acute cardiopulmonary process. Electronically Signed   By: Elgie Collard M.D.   On: 06/02/2020 22:58     Assessment/Plan 55 year old male from [place] with history of CAD s/p MIs, CABG with multiple stents, CHF, HTN presenting with typical chest pain similar to prior MIs    Chest pain   CAD with hx of MI, CABG and stent  angioplasty - Patient with typical chest pain but with troponin 15-17 and EKG nonacute - Continue aspirin, atorvastatin, metoprolol, nitropaste with morphine for breakthrough - Cardiology consult - Past records unavailable in Care Everywhere  Elevated blood sugar - Blood sugar 214 - Follow A1c to screen for diabetes  Hypertension - Continue metoprolol pending med rec    CHF (congestive heart failure) (HCC) - Not acutely exacerbated, chest x-ray clear - Continue meds as above    DVT prophylaxis: Lovenox  Code Status: full code  Family Communication:  none  Disposition Plan: Back to previous home environment Consults called: Cardiology Status: Observation    Andris Baumann MD [facility]     06/03/2020, 4:39 AM

## 2020-06-03 NOTE — Consult Note (Addendum)
Cardiology Consult    Patient ID: Richard Malone MRN: [id number], DOB/AGE: 1965-10-11   Admit date: 06/03/2020 Date of Consult: 06/03/2020  Primary Physician: System, Provider Not In Primary Cardiologist: None - lives  Requesting Provider: Kerry Hough, MD.  Patient Profile    Richard Malone is a 55 y.o. male with a history of obesity, MI, HTN, CAD, and hyperlipidemia, who is being seen today for the evaluation of chest pain at the request of Dr. Kerry Hough.  Past Medical History   Past Medical History:  Diagnosis Date  . CAD (coronary artery disease)    a. s/p first MI @ age 31; b. 2004 s/p CABG @ [facility]; c. reports h/o 16 stents; d. last cath ~ 4 yrs ago in [place].  Marland Kitchen HTN (hypertension)   . Hyperlipidemia LDL goal <70    a. Statin/PCSK9i intolerant. Does not want to try alternate agents.  . MI (myocardial infarction) (HCC)   . Obesity     Past Surgical History:  Procedure Laterality Date  . CHOLECYSTECTOMY    . CORONARY ARTERY BYPASS GRAFT    . TONSILLECTOMY       Allergies  Allergies  Allergen Reactions  . Drug Ingredient [Atorvastatin]     History of Present Illness    55 year old male with a prior history of obesity, hyperlipidemia, HTN, MI and CAD. He has a significant family history of premature CAD, as well.  His first MI occurred at age 77.  He underwent CABG in ~ 2004 and has had a total of 16 stents placed.  He thinks that his last cath was about 4 yrs ago, "maybe somewhere in [place]."  He works as a Control and instrumentation engineer and has not been able to establish a cardiologist to routinely follow up as he travels around the country often due to his job.  At baseline, he does not typically experience chest pain or dyspnea.  He is relatively sedentary.  He does carry sl NTG w/ him but rarely uses - last dose 10-12 months ago.  He was in his USOH on 5/25.  He traveled from [place] to [place], [place] via plane and then drove from [place] to [place] w/ his girlfriend, to visit friends.   He says that they stopped and walked around frequently.  About 20 mins outside of [place], he became diaphoretic and began to experience chest pain/tightness, radiating to the jaw and left arm with dyspnea, nausea and vomiting. Patient states he has experienced these symptoms before with previous MIs.  He took sl ntg en route w/o relief, prompting him to present to the ED.  Here, symptoms eventually resolved following asa, morphine, and ntp, though total duration of symptoms was ~ 6 hrs.  Despite prolonged symptoms, ECG is w/o acute ST/T changes and troponins have remained normal.  He did have recurrent c/p earlier this AM, which quickly resolved after additional doses of morphine.  Inpatient Medications    . aspirin EC  325 mg Oral Daily  . clopidogrel  75 mg Oral Daily  . enoxaparin (LOVENOX) injection  0.5 mg/kg Subcutaneous Q24H  . insulin aspart  0-15 Units Subcutaneous TID WC  . insulin aspart  0-5 Units Subcutaneous QHS  . lisinopril  10 mg Oral Daily  . metoprolol succinate  25 mg Oral BID  . nitroGLYCERIN  1 inch Topical Q6H WA  . pantoprazole  40 mg Oral Daily  . ranolazine  500 mg Oral BID  . sodium chloride flush  3 mL Intravenous Q12H  Family History    Family History  Problem Relation Age of Onset  . CAD Mother        premature CAD  . CAD Father        premature CAD  . CAD Sister   . Cancer Sister    He indicated that his mother is deceased. He indicated that his father is deceased. He indicated that his sister is alive. He indicated that his maternal grandmother is deceased. He indicated that his maternal grandfather is deceased. He indicated that his paternal grandmother is deceased. He indicated that his paternal grandfather is deceased.   Social History    Social History   Socioeconomic History  . Marital status: Single    Spouse name: Not on file  . Number of children: Not on file  . Years of education: Not on file  . Highest education level: Not on  file  Occupational History  . Not on file  Tobacco Use  . Smoking status: Former Smoker    Packs/day: 1.00    Years: 15.00    Pack years: 15.00    Types: Cigarettes  . Smokeless tobacco: Never Used  Vaping Use  . Vaping Use: Never used  Substance and Sexual Activity  . Alcohol use: Not Currently  . Drug use: Never  . Sexual activity: Not on file  Other Topics Concern  . 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  Usually spends his [place] in [place].   Social Determinants of Health   Financial Resource Strain: Not on file  Food Insecurity: Not on file  Transportation Needs: Not on file  Physical Activity: Not on file  Stress: Not on file  Social Connections: Not on file  Intimate Partner Violence: Not on file     Review of Systems    General:  No chills, fever, night sweats or weight changes.  Cardiovascular:  +++ 5/10 chest pain, +++ dyspnea last night, no edema, orthopnea, palpitations, paroxysmal nocturnal dyspnea. Dermatological: No rash, lesions/masses Respiratory: No cough, +++ dyspnea w/ chest pain last night. Urologic: No hematuria, dysuria Abdominal:   +++ nausea and vomiting prior to presentation, no diarrhea, bright red blood per rectum, melena, or hematemesis. Neurologic:  No visual changes, wkns, changes in mental status. All other systems reviewed and are otherwise negative except as noted above.  Physical Exam    Blood pressure 119/75, pulse 74, temperature 98.1 F (36.7 C), temperature source Oral, resp. rate 20, height 5\' 10"  (1.778 m), weight 122.5 kg, SpO2 95 %.  General: Pleasant, NAD Psych: Normal affect. Neuro: Alert and oriented X 3. Moves all extremities spontaneously. HEENT: Normal  Neck: Supple, obese - difficult to gauge JVP.  No bruits. Lungs:  Resp regular and unlabored, CTA. Heart:  RRR no s3, s4, or murmurs.  Abdomen: Obese, non-tender, BS + x 4.  Extremities: No clubbing, cyanosis or edema. DP/PT2+, Radials 2+ and equal bilaterally.   Labs    Cardiac Enzymes Recent Labs  Lab 06/02/20 2242 06/03/20 0137 06/03/20 0954  TROPONINIHS 15 17 11       Lab Results  Component Value Date   WBC 8.4 06/02/2020   HGB 10.8 (L) 06/02/2020   HCT 36.2 (L) 06/02/2020   MCV 79.0 (L) 06/02/2020   PLT 338 06/02/2020    Recent Labs  Lab 06/02/20 2242  NA 136  K 3.7  CL 103  CO2 22  BUN 12  CREATININE 0.90  CALCIUM 8.6*  PROT 7.1  BILITOT 0.4  ALKPHOS 73  ALT 13  AST 17  GLUCOSE 214*   Lab Results  Component Value Date   DDIMER 1.20 (H) 06/03/2020     Radiology Studies    DG Chest 2 View  Result Date: 06/02/2020 CLINICAL DATA:  55 year old male with chest pain. EXAM: CHEST - 2 VIEW COMPARISON:  None. FINDINGS: No focal consolidation, pleural effusion, or pneumothorax. Mild cardiomegaly. Median sternotomy wires and coronary stent. No acute osseous pathology. Several metallic pellets noted over the right shoulder. IMPRESSION: No acute cardiopulmonary process. Electronically Signed   By: Elgie Collard M.D.   On: 06/02/2020 22:58    ECG & Cardiac Imaging    SR @ 72 RAD, LPFB, LAE, ? Prior anterior infarct - personally reviewed.  Assessment & Plan    1. Unstable Angina/CAD: 55 y/o w/ a h/o premature CAD s/p first MI @ 50, CABG in 2004, and a reported total of 17 stents in his lifetime.  Last cath ~ 4 yrs ago, possibly in [place]. He doesn't have any stent cards w/ him.  He currently lives in [place], but travels significantly for work, and does not have a regular cardiologist.  He presented 5/25 w/ chest pain, dyspnea, n, and vomiting, after driving for ~ 5 hrs.  He noted that pain was similar to prior angina.  Here, High-sensitivity troponins have been nl despite prolonged symptoms, and EKG does not show any acute ST/T changes (no old EKGs for comparison).  Pain  initially resolved after ~ 6.5 hrs w/ asa, ntp, and morphine, though he has had some recurrence requiring additional doses of morphine.  Pt looked comfortable when interviewed.  Given patient's symptoms, history, and high pretest probability for recurrent obstructive coronary disease, we will plan on cath today.  The patient understands that risks include but are not limited to stroke (1 in 1000), death (1 in 1000), kidney failure [usually temporary] (1 in 500), bleeding (1 in 200), allergic reaction [possibly serious] (1 in 200), and agrees to proceed.  Cont asa, plavix,  blocker, and ranexa.  He is intolerant to statins and also reports myalgias w/ PCSK9i in the past.  He is not willing to try zetia or bempedoic acid.  If cath unrevealing, would have a low threshold to r/o PE given prolonged travel and elevated d dimer.  He is not hypoxic/tachycardic, and denies leg pain.  No swelling noted on exam.  2. Essential HTN: stable on  blocker and acei.  3.  HL:  Statin intolerant.  Says he had myalgias w/ PCSK9i.  Doesn't want to try zeta/bempedoic acid.  Defer mgmt to outpt care team in [facility].  Might be a good candidate for inclisiran.    4.  Microcytic anemia:  Pt reports h/o ulcers dating back to his teens.  He has been on asa/plavix chronically.  Follow and consider GI eval if cath unremarkable.  Cont PPI.  Signed, Nicolasa Ducking, NP 06/03/2020, 12:31 PM  For questions or updates, please contact   Please consult [email or web address] for contact info under Cardiology/STEMI.

## 2020-06-04 ENCOUNTER — Observation Stay (HOSPITAL_COMMUNITY)
Admit: 2020-06-04 | Discharge: 2020-06-04 | Disposition: A | Discharge status: Home / Self Care | Payer: Self-pay | Attending: Internal Medicine | Admitting: Internal Medicine

## 2020-06-04 ENCOUNTER — Encounter: Payer: Self-pay | Admitting: Internal Medicine

## 2020-06-04 DIAGNOSIS — I10 Essential (primary) hypertension: Secondary | ICD-10-CM

## 2020-06-04 DIAGNOSIS — R079 Chest pain, unspecified: Secondary | ICD-10-CM

## 2020-06-04 DIAGNOSIS — I200 Unstable angina: Secondary | ICD-10-CM

## 2020-06-04 DIAGNOSIS — I25118 Atherosclerotic heart disease of native coronary artery with other forms of angina pectoris: Secondary | ICD-10-CM

## 2020-06-04 LAB — BASIC METABOLIC PANEL
Anion gap: 8 (ref 5–15)
BUN: 13 mg/dL (ref 6–20)
CO2: 24 mmol/L (ref 22–32)
Calcium: 7.6 mg/dL — ABNORMAL LOW (ref 8.9–10.3)
Chloride: 102 mmol/L (ref 98–111)
Creatinine, Ser: 0.94 mg/dL (ref 0.61–1.24)
GFR, Estimated: >60 mL/min (ref >60)
Glucose, Bld: 306 mg/dL — ABNORMAL HIGH (ref 70–99)
Potassium: 3.7 mmol/L (ref 3.5–5.1)
Sodium: 134 mmol/L — ABNORMAL LOW (ref 135–145)

## 2020-06-04 LAB — ECHOCARDIOGRAM COMPLETE: S' Lateral: 3.89 cm

## 2020-06-04 LAB — CBC
HCT: 30.7 % — ABNORMAL LOW (ref 39.0–52.0)
Hemoglobin: 9.2 g/dL — ABNORMAL LOW (ref 13.0–17.0)
MCH: 23.4 pg — ABNORMAL LOW (ref 26.0–34.0)
MCHC: 30 g/dL (ref 30.0–36.0)
MCV: 78.1 fL — ABNORMAL LOW (ref 80.0–100.0)
Platelets: 297 10*3/uL (ref 150–400)
RBC: 3.93 MIL/uL — ABNORMAL LOW (ref 4.22–5.81)
RDW: 17.6 % — ABNORMAL HIGH (ref 11.5–15.5)
WBC: 7.2 10*3/uL (ref 4.0–10.5)
nRBC: 0 % (ref 0.0–0.2)

## 2020-06-04 LAB — LIPID PANEL
Cholesterol: 152 mg/dL (ref 0–200)
HDL: 34 mg/dL — ABNORMAL LOW (ref >40)
LDL Cholesterol: 72 mg/dL (ref 0–99)
Total CHOL/HDL Ratio: 4.5 RATIO
Triglycerides: 232 mg/dL — ABNORMAL HIGH (ref <150)
VLDL: 46 mg/dL — ABNORMAL HIGH (ref 0–40)

## 2020-06-04 LAB — GLUCOSE, CAPILLARY
Glucose-Capillary: 158 mg/dL — ABNORMAL HIGH (ref 70–99)
Glucose-Capillary: 233 mg/dL — ABNORMAL HIGH (ref 70–99)
Glucose-Capillary: 109 mg/dL — ABNORMAL HIGH (ref 70–99)
Glucose-Capillary: 151 mg/dL — ABNORMAL HIGH (ref 70–99)

## 2020-06-04 MED ORDER — PANTOPRAZOLE SODIUM 40 MG PO TBEC
40.0000 mg | DELAYED_RELEASE_TABLET | Freq: Two times a day (BID) | ORAL | Status: DC
  Administered 2020-06-04 – 2020-06-07 (×7): 40 mg via ORAL
  Filled 2020-06-04 (×7): qty 1

## 2020-06-04 MED ORDER — ACETAMINOPHEN 325 MG PO TABS: 650.0000 mg | ORAL_TABLET | ORAL | Status: DC | PRN

## 2020-06-04 MED ORDER — ENOXAPARIN SODIUM 60 MG/0.6ML IJ SOSY
60.0000 mg | PREFILLED_SYRINGE | INTRAMUSCULAR | Status: DC
  Administered 2020-06-05: 60 mg via SUBCUTANEOUS
  Filled 2020-06-04: qty 0.6

## 2020-06-04 MED ORDER — GLIPIZIDE 5 MG PO TABS
5.0000 mg | ORAL_TABLET | Freq: Every day | ORAL | Status: DC
  Administered 2020-06-04 – 2020-06-07 (×4): 5 mg via ORAL
  Filled 2020-06-04 (×5): qty 1

## 2020-06-04 MED ORDER — LISINOPRIL 5 MG PO TABS
5.0000 mg | ORAL_TABLET | Freq: Every day | ORAL | Status: DC
  Administered 2020-06-05 – 2020-06-07 (×3): 5 mg via ORAL
  Filled 2020-06-04 (×3): qty 1

## 2020-06-04 MED ORDER — ISOSORBIDE DINITRATE 10 MG PO TABS
10.0000 mg | ORAL_TABLET | Freq: Three times a day (TID) | ORAL | Status: DC
  Administered 2020-06-04 – 2020-06-07 (×11): 10 mg via ORAL
  Filled 2020-06-04 (×12): qty 1

## 2020-06-04 NOTE — Progress Notes (Signed)
*  PRELIMINARY RESULTS* Echocardiogram 2D Echocardiogram has been performed.  Richard Malone 06/04/2020, 7:51 AM

## 2020-06-04 NOTE — Progress Notes (Signed)
Progress Note  Patient Name: Richard Malone Date of Encounter: 06/04/2020  Primary Cardiologist: None  Subjective   Feels worse than upon arrival, says he's been having chest pain on and off, woke up from his sleep diaphoretic in 10/10 chest pain radiating to jaw. Continues to feel nauseated - improves w/ zofran.    Inpatient Medications    Scheduled Meds: . aspirin  81 mg Oral Daily  . clopidogrel  75 mg Oral Daily  . enoxaparin (LOVENOX) injection  62.5 mg Subcutaneous Q24H  . glipiZIDE  5 mg Oral QAC breakfast  . insulin aspart  0-15 Units Subcutaneous TID WC  . insulin aspart  0-5 Units Subcutaneous QHS  . isosorbide dinitrate  10 mg Oral TID  . [START ON 06/05/2020] lisinopril  5 mg Oral Daily  . metoprolol succinate  25 mg Oral BID  . pantoprazole  40 mg Oral Daily  . ranolazine  1,000 mg Oral BID  . sodium chloride flush  3 mL Intravenous Q12H  . sodium chloride flush  3 mL Intravenous Q12H   Continuous Infusions: . sodium chloride     PRN Meds: sodium chloride, acetaminophen, morphine injection, nitroGLYCERIN, ondansetron (ZOFRAN) IV, sodium chloride flush   Vital Signs    Vitals:   06/03/20 1926 06/04/20 0628 06/04/20 0738 06/04/20 1129  BP: (!) 100/54 120/79 106/67 (!) 108/57  Pulse: 80 68 68 70  Resp: 20 20 18 18   Temp: 98.4 F (36.9 C) 98 F (36.7 C) 97.7 F (36.5 C) 98.1 F (36.7 C)  TempSrc: Oral Oral Oral Oral  SpO2: 99% 98% 98% 99%  Weight:      Height:        Intake/Output Summary (Last 24 hours) at 06/04/2020 1233 Last data filed at 06/04/2020 1000 Gross per 24 hour  Intake 2280 ml  Output --  Net 2280 ml   Filed Weights   06/02/20 2237  Weight: 122.5 kg    Physical Exam   GEN: Pleasant, NAD.  Psych: Normal affect. Neuro: AAOx3. Strength and sensation are intact. HEENT: Normal.  Neck: Supple, obese - difficult to gauge JVP. No bruits. Cardiac: RRR, no murmurs, rubs, or gallops.  Respiratory:  Respirations regular and unlabored,  CTA bilaterally. GI: Obese, non-tender, BS + x 4. MS: No clubbing, cyanosis, edema.  Radials 2+, DP 2+ and equal bilaterally. R groin cath site C/D/I with no bleeding, hematomas, or bruits present on auscultation.  Skin: warm and dry, no rash.  Labs    Chemistry Recent Labs  Lab 06/02/20 2242 06/04/20 0056  NA 136 134*  K 3.7 3.7  CL 103 102  CO2 22 24  GLUCOSE 214* 306*  BUN 12 13  CREATININE 0.90 0.94  CALCIUM 8.6* 7.6*  PROT 7.1  --   ALBUMIN 3.5  --   AST 17  --   ALT 13  --   ALKPHOS 73  --   BILITOT 0.4  --   GFRNONAA >60 >60  ANIONGAP 11 8     Hematology Recent Labs  Lab 06/02/20 2242 06/04/20 0056  WBC 8.4 7.2  RBC 4.58 3.93*  HGB 10.8* 9.2*  HCT 36.2* 30.7*  MCV 79.0* 78.1*  MCH 23.6* 23.4*  MCHC 29.8* 30.0  RDW 18.0* 17.6*  PLT 338 297    Cardiac Enzymes  Recent Labs  Lab 06/02/20 2242 06/03/20 0137 06/03/20 0954  TROPONINIHS 15 17 11       DDimer  Recent Labs  Lab 06/03/20 1107  DDIMER 1.20*  Lipids  Lab Results  Component Value Date   CHOL 152 06/04/2020   HDL 34 (L) 06/04/2020   LDLCALC 72 06/04/2020   TRIG 232 (H) 06/04/2020   CHOLHDL 4.5 06/04/2020    HbA1c  Lab Results  Component Value Date   HGBA1C 7.9 (H) 06/03/2020    Radiology    DG Chest 2 View  Result Date: 06/02/2020 CLINICAL DATA:  55 year old male with chest pain. EXAM: CHEST - 2 VIEW COMPARISON:  None. FINDINGS: No focal consolidation, pleural effusion, or pneumothorax. Mild cardiomegaly. Median sternotomy wires and coronary stent. No acute osseous pathology. Several metallic pellets noted over the right shoulder. IMPRESSION: No acute cardiopulmonary process. Electronically Signed   By: Elgie Collard M.D.   On: 06/02/2020 22:58   Telemetry    SR @ 70's - Personally Reviewed  Cardiac Studies   Cardiac Catheterization 5.26.2022  Left Main  Vessel is large. There is mild diffuse disease throughout the vessel.  Left Anterior Descending  Vessel is  moderate in size.  Prox LAD lesion is 50% stenosed.  Mid LAD lesion is 100% stenosed. The lesion is chronically occluded.  Mid LAD to Dist LAD lesion is 40% stenosed. The lesion was previously treated. Previously placed stent displays restenosis.  Dist LAD lesion is 80% stenosed.  First Diagonal Branch  Vessel is large in size.  1st Diag lesion is 100% stenosed. The lesion is chronically occluded. The lesion was previously treated.  Second Diagonal Branch  2nd Diag lesion is 100% stenosed. The lesion is chronically occluded.  Third Diagonal Branch  Vessel is small in size.  Left Circumflex  Vessel is moderate in size.  Mid Cx to Dist Cx lesion is 100% stenosed. The lesion is chronically occluded.  First Obtuse Marginal Branch  Vessel is small in size.  Second Obtuse Marginal Branch  2nd Mrg-1 lesion is 100% stenosed. The lesion is chronically occluded. The lesion was previously treated.  2nd Mrg-2 lesion is 100% stenosed. The lesion is chronically occluded.  Third Obtuse Marginal Branch  Vessel is moderate in size. There is moderate disease in the vessel.  Right Coronary Artery  Vessel is large. There is mild diffuse disease throughout the vessel.  Dist RCA lesion is 30% stenosed.  Right Posterior Descending Artery  Vessel is moderate in size.  RPDA lesion is 30% stenosed.  Right Posterior Atrioventricular Artery  Vessel is moderate in size. There is moderate disease in the vessel.  Saphenous Graft To 1st Diag  SVG graft was visualized by angiography and is normal in caliber. The graft exhibits no disease.  Sequential Graft To 2nd Mrg, 3rd Mrg  Mid Graft to Dist Graft lesion before 2nd Mrg is 25% stenosed. The lesion was previously treated. Previously placed stent displays restenosis.      Patient Profile     55 y.o. male with a history of obesity, MI, HTN, CAD, and hyperlipidemia, admitted 5/26 w/ [place], r/o, s/p cath w/ multivessel dzs including graft dzs.  Assessment & Plan     1. Unstable Angina/CAD: 55 y/o w/ a h/o premature CAD s/p first MI @ 38, CABG in 2004, and a reported total of 16 stents in his lifetime.  Last cath ~ 4 yrs ago, possibly in [place]. He doesn't have any stent cards w/ him.  He currently lives in [place], but travels significantly for work, and does not have a regular cardiologist.  He presented 5/25 w/ chest pain, dyspnea, n, and vomiting, after driving for ~ 5 hrs.  He noted that pain was similar to prior angina.  Here, High-sensitivity troponins have been nl despite prolonged symptoms, and EKG does not show any acute ST/T changes (no old EKGs for comparison).  Pain initially resolved after ~ 6.5 hrs w/ asa, ntp, and morphine, though he has had some recurrence requiring additional doses of morphine.    - Cath 5/26 showed severe multivessel native dzs w/ patent LIMA  LAD, patent VG  Diag, though diag was occluded, and patent VG  OM2 (site occluded)  OM3.  No interventional targets  med rx recommended.  - Increased Renexa to 1000 mg BID. - Decreased dose of Lisinopril to 5 mg.  - Added Isosorbide dinitrate 10 mg TID (h/o headaches w/ imdur previously- will have to watch for intolerance) - ECHO pending  - Given prolonged travel and elevated D-Dimer 1.20, consider V/Q scan vs CTA chest, to assess for PE. He is not hypoxic/tachycardic, and denies leg pain. No swelling noted on exam this morning. However, he cont to have unexplained chest pain, dyspnea, and diaphoresis.  2. Essential HTN: stable on  blocker and acei.  Lisinopril dose reduced in order to make room for short-acting nitrate rx.  3. HL: Statin intolerant. Says he had myalgias w/ PCSK9i. Doesn't want to try zeta/bempedoic acid. Defer mgmt to outpt care in [facility].   4. Microcytic anemia/PUD: H/o ulcers dating back to his teens. Chronic use of asa/plavix. H/H down since admission - 9.2/30.7.  ? If PUD may be playing role in current Ss, especially in light of nl troponins and absence of interventional  targets on cath.  Continue PPI. Follow and consider GI eval.   5. DM: New dx.  Management per primary team.  Signed, Nicolasa Ducking, NP  06/04/2020, 12:33 PM    For questions or updates, please contact   Please consult [email or web address] for contact info under Cardiology/STEMI.

## 2020-06-04 NOTE — Progress Notes (Signed)
PROGRESS NOTE    Richard Malone  KAJ:[id number] DOB: 05/05/1965 DOA: 06/03/2020 PCP: System, Provider Not In    Brief Narrative:  55 year old male with a history of coronary artery disease status post CABG, admitted to the hospital with chest pain.  Concern for unstable angina.  Underwent cardiac cath on 5/26 that did not show any interventional targets.  Recommendations for medical management.   Assessment & Plan:   Active Problems:   Chest pain   CAD (coronary artery disease)   CHF (congestive heart failure) (HCC)   Unstable angina (HCC)   Chest pain with concerns for unstable angina -Cardiac enzymes were negative on admission -Due to significant cardiac history and concerning symptoms, he underwent cardiac cath on 5/26 -Cath showed severe multivessel native disease with no interventional targets.  Recommendations were for medical management -Continues to have chest discomfort -Currently on dual antiplatelet therapy, Ranexa -Started on isosorbide dinitrate -Echo done with report pending  Hypertension -Blood pressure currently stable on metoprolol and lisinopril  Diabetes mellitus, type II -Unclear if this is a new diagnosis since he does report being on Farxiga in the past.  He is also aware of prior elevated A1c -Current A1c of 7.9 -He is agreeable to start glipizide, does not wish to start metformin at this time  Hyperlipidemia -Intolerant of statins, does not wish to start Zetia  GERD -Continue on Protonix   DVT prophylaxis: Lovenox  Code Status: Full code Family Communication: Discussed with patient Disposition Plan: Status is: Inpatient  Remains inpatient appropriate because:Inpatient level of care appropriate due to severity of illness   Dispo: The patient is from: Home              Anticipated d/c is to: Home              Patient currently is not medically stable to d/c.   Difficult to place patient No   Consultants:   Cardiology  Procedures:    Cardiac cath 5/26  Antimicrobials:       Subjective: Patient reports intermittent chest pain overnight with shortness of breath and diaphoresis  Objective: Vitals:   06/04/20 0628 06/04/20 0738 06/04/20 1129 06/04/20 1618  BP: 120/79 106/67 (!) 108/57 135/79  Pulse: 68 68 70 68  Resp: 20 18 18 18   Temp: 98 F (36.7 C) 97.7 F (36.5 C) 98.1 F (36.7 C) 98 F (36.7 C)  TempSrc: Oral Oral Oral Oral  SpO2: 98% 98% 99% 100%  Weight:      Height:        Intake/Output Summary (Last 24 hours) at 06/04/2020 1642 Last data filed at 06/04/2020 1350 Gross per 24 hour  Intake 2520 ml  Output --  Net 2520 ml   Filed Weights   06/02/20 2237  Weight: 122.5 kg    Examination:  General exam: Appears calm and comfortable  Respiratory system: Clear to auscultation. Respiratory effort normal. Cardiovascular system: S1 & S2 heard, RRR. No JVD, murmurs, rubs, gallops or clicks. No pedal edema. Gastrointestinal system: Abdomen is nondistended, soft and nontender. No organomegaly or masses felt. Normal bowel sounds heard. Central nervous system: Alert and oriented. No focal neurological deficits. Extremities: Symmetric 5 x 5 power. Skin: No rashes, lesions or ulcers Psychiatry: Judgement and insight appear normal. Mood & affect appropriate.     Data Reviewed: I have personally reviewed following labs and imaging studies  CBC: Recent Labs  Lab 06/02/20 2242 06/04/20 0056  WBC 8.4 7.2  NEUTROABS 5.1  --  HGB 10.8* 9.2*  HCT 36.2* 30.7*  MCV 79.0* 78.1*  PLT 338 297   Basic Metabolic Panel: Recent Labs  Lab 06/02/20 2242 06/04/20 0056  NA 136 134*  K 3.7 3.7  CL 103 102  CO2 22 24  GLUCOSE 214* 306*  BUN 12 13  CREATININE 0.90 0.94  CALCIUM 8.6* 7.6*   GFR: Estimated Creatinine Clearance: 119.3 mL/min (by C-G formula based on SCr of 0.94 mg/dL). Liver Function Tests: Recent Labs  Lab 06/02/20 2242  AST 17  ALT 13  ALKPHOS 73  BILITOT 0.4  PROT 7.1   ALBUMIN 3.5   No results for input(s): LIPASE, AMYLASE in the last 168 hours. No results for input(s): AMMONIA in the last 168 hours. Coagulation Profile: No results for input(s): INR, PROTIME in the last 168 hours. Cardiac Enzymes: No results for input(s): CKTOTAL, CKMB, CKMBINDEX, TROPONINI in the last 168 hours. BNP (last 3 results) No results for input(s): PROBNP in the last 8760 hours. HbA1C: Recent Labs    06/03/20 1107  HGBA1C 7.9*   CBG: Recent Labs  Lab 06/03/20 1834 06/04/20 0747 06/04/20 1131 06/04/20 1612  GLUCAP 179* 158* 233* 109*   Lipid Profile: Recent Labs    06/04/20 0056  CHOL 152  HDL 34*  LDLCALC 72  TRIG 734*  CHOLHDL 4.5   Thyroid Function Tests: No results for input(s): TSH, T4TOTAL, FREET4, T3FREE, THYROIDAB in the last 72 hours. Anemia Panel: No results for input(s): VITAMINB12, FOLATE, FERRITIN, TIBC, IRON, RETICCTPCT in the last 72 hours. Sepsis Labs: No results for input(s): PROCALCITON, LATICACIDVEN in the last 168 hours.  Recent Results (from the past 240 hour(s))  SARS CORONAVIRUS 2 (TAT 6-24 HRS) Nasopharyngeal Nasopharyngeal Swab     Status: None   Collection Time: 06/03/20  5:37 AM   Specimen: Nasopharyngeal Swab  Result Value Ref Range Status   SARS Coronavirus 2 NEGATIVE NEGATIVE Final    Comment: (NOTE) SARS-CoV-2 target nucleic acids are NOT DETECTED.  The SARS-CoV-2 RNA is generally detectable in upper and lower respiratory specimens during the acute phase of infection. Negative results do not preclude SARS-CoV-2 infection, do not rule out co-infections with other pathogens, and should not be used as the sole basis for treatment or other patient management decisions. Negative results must be combined with clinical observations, patient history, and epidemiological information. The expected result is Negative.  Fact Sheet for Patients: HairSlick.no  Fact Sheet for Healthcare  Providers: quierodirigir.com  This test is not yet approved or cleared by the [place] FDA and  has been authorized for detection and/or diagnosis of SARS-CoV-2 by FDA under an Emergency Use Authorization (EUA). This EUA will remain  in effect (meaning this test can be used) for the duration of the COVID-19 declaration under Se ction 564(b)(1) of the Act, 21 U.S.C. section 360bbb-3(b)(1), unless the authorization is terminated or revoked sooner.  Performed at [facility], [address] [place], [place] [address]          Radiology Studies: DG Chest 2 View  Result Date: 06/02/2020 CLINICAL DATA:  55 year old male with chest pain. EXAM: CHEST - 2 VIEW COMPARISON:  None. FINDINGS: No focal consolidation, pleural effusion, or pneumothorax. Mild cardiomegaly. Median sternotomy wires and coronary stent. No acute osseous pathology. Several metallic pellets noted over the right shoulder. IMPRESSION: No acute cardiopulmonary process. Electronically Signed   By: Elgie Collard M.D.   On: 06/02/2020 22:58   CARDIAC CATHETERIZATION  Result Date: 06/04/2020 Conclusions: 1.  Severe left coronary artery disease, including chronic total occlusions of mid LAD, D1, D2, OM2, and distal LCx. 2. Mild to moderate, non-obstructive disease involving dominant RCA. 3. Patent LIMA-LAD with 40% in-stent restenosis of distal LAD stent and 80% stenosis involving the apical LAD. 4. Patent SVG-D1 with occlusion of D1 just beyond the anastomosis. 5. Patent SVG-OM2-OM3 with mild in-stent restenosis of mid-graft stent.  OM2 is occluded proximal and distal to graft anastomosis. 6. Mildly elevated left ventricular filling pressure (LVEDP 20-25 mmHg). 7. Chronically occluded left radial artery. 8. Significant scar tissue in the right groin making femoral access challenging.  Consider left femoral access for future arterial access. Recommendations: 1. No interventional targets; escalate  antianginal therapy. 2. Gentle diuresis. 3. Obtain echocardiogram. 4. Aggressive secondary prevention. Yvonne Kendall, MD [facility]   PERIPHERAL VASCULAR CATHETERIZATION  Result Date: 06/04/2020 Conclusions: 1. Severe left coronary artery disease, including chronic total occlusions of mid LAD, D1, D2, OM2, and distal LCx. 2. Mild to moderate, non-obstructive disease involving dominant RCA. 3. Patent LIMA-LAD with 40% in-stent restenosis of distal LAD stent and 80% stenosis involving the apical LAD. 4. Patent SVG-D1 with occlusion of D1 just beyond the anastomosis. 5. Patent SVG-OM2-OM3 with mild in-stent restenosis of mid-graft stent.  OM2 is occluded proximal and distal to graft anastomosis. 6. Mildly elevated left ventricular filling pressure (LVEDP 20-25 mmHg). 7. Chronically occluded left radial artery. 8. Significant scar tissue in the right groin making femoral access challenging.  Consider left femoral access for future arterial access. Recommendations: 1. No interventional targets; escalate antianginal therapy. 2. Gentle diuresis. 3. Obtain echocardiogram. 4. Aggressive secondary prevention. Yvonne Kendall, MD [facility]        Scheduled Meds: . aspirin  81 mg Oral Daily  . clopidogrel  75 mg Oral Daily  . [START ON 06/05/2020] enoxaparin (LOVENOX) injection  60 mg Subcutaneous Q24H  . glipiZIDE  5 mg Oral QAC breakfast  . insulin aspart  0-15 Units Subcutaneous TID WC  . insulin aspart  0-5 Units Subcutaneous QHS  . isosorbide dinitrate  10 mg Oral TID  . [START ON 06/05/2020] lisinopril  5 mg Oral Daily  . metoprolol succinate  25 mg Oral BID  . pantoprazole  40 mg Oral BID AC  . ranolazine  1,000 mg Oral BID  . sodium chloride flush  3 mL Intravenous Q12H  . sodium chloride flush  3 mL Intravenous Q12H   Continuous Infusions: . sodium chloride       LOS: 0 days    Time spent: 35 mins    Erick Blinks, MD [facility]   If 7PM-7AM, please contact  night-coverage [email or web address]  06/04/2020, 4:42 PM

## 2020-06-05 LAB — GLUCOSE, CAPILLARY
Glucose-Capillary: 163 mg/dL — ABNORMAL HIGH (ref 70–99)
Glucose-Capillary: 205 mg/dL — ABNORMAL HIGH (ref 70–99)
Glucose-Capillary: 167 mg/dL — ABNORMAL HIGH (ref 70–99)
Glucose-Capillary: 159 mg/dL — ABNORMAL HIGH (ref 70–99)

## 2020-06-05 LAB — HEMOGLOBIN AND HEMATOCRIT, BLOOD
HCT: 34.6 % — ABNORMAL LOW (ref 39.0–52.0)
Hemoglobin: 10.5 g/dL — ABNORMAL LOW (ref 13.0–17.0)

## 2020-06-05 MED ORDER — TRAMADOL HCL 50 MG PO TABS: 50.0000 mg | ORAL_TABLET | Freq: Four times a day (QID) | ORAL | Status: DC | PRN

## 2020-06-05 MED ORDER — OXYCODONE-ACETAMINOPHEN 5-325 MG PO TABS
1.0000 | ORAL_TABLET | ORAL | Status: DC | PRN
  Administered 2020-06-05: 2 via ORAL
  Administered 2020-06-05: 1 via ORAL
  Administered 2020-06-06 (×2): 2 via ORAL
  Administered 2020-06-06: 1 via ORAL
  Administered 2020-06-06 – 2020-06-07 (×3): 2 via ORAL
  Filled 2020-06-05: qty 2
  Filled 2020-06-05: qty 1
  Filled 2020-06-05 (×5): qty 2
  Filled 2020-06-05: qty 1

## 2020-06-05 NOTE — Progress Notes (Signed)
No acute events overnight, complaints of chest discomfort start of shift medicated as ordered with morphine iv. Settles in bed, observation continues.

## 2020-06-05 NOTE — Progress Notes (Signed)
Progress Note  Patient Name: Richard Malone Date of Encounter: 06/05/2020  Primary Cardiologist: Lives in [facility]  Subjective   Intermittent c/p throughout the day yesterday - req morphine q 3-4 hrs.  Says this AM that he had dark stools a few days ago along w/ coffee ground emesis on the evening of admission.  Groin sore this AM.  Inpatient Medications    Scheduled Meds: . aspirin  81 mg Oral Daily  . clopidogrel  75 mg Oral Daily  . enoxaparin (LOVENOX) injection  60 mg Subcutaneous Q24H  . glipiZIDE  5 mg Oral QAC breakfast  . insulin aspart  0-15 Units Subcutaneous TID WC  . insulin aspart  0-5 Units Subcutaneous QHS  . isosorbide dinitrate  10 mg Oral TID  . lisinopril  5 mg Oral Daily  . metoprolol succinate  25 mg Oral BID  . pantoprazole  40 mg Oral BID AC  . ranolazine  1,000 mg Oral BID  . sodium chloride flush  3 mL Intravenous Q12H  . sodium chloride flush  3 mL Intravenous Q12H   Continuous Infusions: . sodium chloride     PRN Meds: sodium chloride, acetaminophen, nitroGLYCERIN, ondansetron (ZOFRAN) IV, oxyCODONE-acetaminophen, sodium chloride flush   Vital Signs    Vitals:   06/05/20 0027 06/05/20 0516 06/05/20 0805 06/05/20 1025  BP: 122/77 108/67 119/83 126/80  Pulse: 66 62 66 67  Resp: 20 20 10    Temp:  97.8 F (36.6 C) 97.8 F (36.6 C)   TempSrc:  Oral Oral   SpO2:  99% 98% 99%  Weight:      Height:        Intake/Output Summary (Last 24 hours) at 06/05/2020 1109 Last data filed at 06/04/2020 1830 Gross per 24 hour  Intake 720 ml  Output --  Net 720 ml   Filed Weights   06/02/20 2237  Weight: 122.5 kg    Physical Exam   GEN: Obese, in no acute distress.  HEENT: Grossly normal.  Neck: Supple, obese, difficult to gauge JVP.  No carotid bruits, or masses. Cardiac: RRR, no murmurs, rubs, or gallops. No clubbing, cyanosis, edema.  Radials 2+, DP/PT 2+ and equal bilaterally.  R groin cath site w/o bleeding/bruit/hematoma. Respiratory:   Respirations regular and unlabored, clear to auscultation bilaterally. GI: Soft, nontender, nondistended, BS + x 4. MS: no deformity or atrophy. Skin: warm and dry, no rash. Neuro:  Strength and sensation are intact. Psych: AAOx3.  Normal affect.  Labs    Chemistry Recent Labs  Lab 06/02/20 2242 06/04/20 0056  NA 136 134*  K 3.7 3.7  CL 103 102  CO2 22 24  GLUCOSE 214* 306*  BUN 12 13  CREATININE 0.90 0.94  CALCIUM 8.6* 7.6*  PROT 7.1  --   ALBUMIN 3.5  --   AST 17  --   ALT 13  --   ALKPHOS 73  --   BILITOT 0.4  --   GFRNONAA >60 >60  ANIONGAP 11 8     Hematology Recent Labs  Lab 06/02/20 2242 06/04/20 0056  WBC 8.4 7.2  RBC 4.58 3.93*  HGB 10.8* 9.2*  HCT 36.2* 30.7*  MCV 79.0* 78.1*  MCH 23.6* 23.4*  MCHC 29.8* 30.0  RDW 18.0* 17.6*  PLT 338 297    Cardiac Enzymes  Recent Labs  Lab 06/02/20 2242 06/03/20 0137 06/03/20 0954  TROPONINIHS 15 17 11      DDimer  Recent Labs  Lab 06/03/20 1107  DDIMER 1.20*  Lipids  Lab Results  Component Value Date   CHOL 152 06/04/2020   HDL 34 (L) 06/04/2020   LDLCALC 72 06/04/2020   TRIG 232 (H) 06/04/2020   CHOLHDL 4.5 06/04/2020    HbA1c  Lab Results  Component Value Date   HGBA1C 7.9 (H) 06/03/2020    Radiology    DG Chest 2 View  Result Date: 06/02/2020 CLINICAL DATA:  55 year old male with chest pain. EXAM: CHEST - 2 VIEW COMPARISON:  None. FINDINGS: No focal consolidation, pleural effusion, or pneumothorax. Mild cardiomegaly. Median sternotomy wires and coronary stent. No acute osseous pathology. Several metallic pellets noted over the right shoulder. IMPRESSION: No acute cardiopulmonary process. Electronically Signed   By: Elgie Collard M.D.   On: 06/02/2020 22:58   Telemetry    RSR, rare PVCs, 60s - Personally Reviewed  Cardiac Studies   Cardiac Catheterization 5.26.2022  Left Main  Vessel is large. There is mild diffuse disease throughout the vessel.  Left Anterior  Descending  Vessel is moderate in size.  Prox LAD lesion is 50% stenosed.  Mid LAD lesion is 100% stenosed. The lesion is chronically occluded.  Mid LAD to Dist LAD lesion is 40% stenosed. The lesion was previously treated. Previously placed stent displays restenosis.  Dist LAD lesion is 80% stenosed.  First Diagonal Branch  Vessel is large in size.  1st Diag lesion is 100% stenosed. The lesion is chronically occluded. The lesion was previously treated.  Second Diagonal Branch  2nd Diag lesion is 100% stenosed. The lesion is chronically occluded.  Third Diagonal Branch  Vessel is small in size.  Left Circumflex  Vessel is moderate in size.  Mid Cx to Dist Cx lesion is 100% stenosed. The lesion is chronically occluded.  First Obtuse Marginal Branch  Vessel is small in size.  Second Obtuse Marginal Branch  2nd Mrg-1 lesion is 100% stenosed. The lesion is chronically occluded. The lesion was previously treated.  2nd Mrg-2 lesion is 100% stenosed. The lesion is chronically occluded.  Third Obtuse Marginal Branch  Vessel is moderate in size. There is moderate disease in the vessel.  Right Coronary Artery  Vessel is large. There is mild diffuse disease throughout the vessel.  Dist RCA lesion is 30% stenosed.  Right Posterior Descending Artery  Vessel is moderate in size.  RPDA lesion is 30% stenosed.  Right Posterior Atrioventricular Artery  Vessel is moderate in size. There is moderate disease in the vessel.  Saphenous Graft To 1st Diag  SVG graft was visualized by angiography and is normal in caliber. The graft exhibits no disease.  Sequential Graft To 2nd Mrg, 3rd Mrg  Mid Graft to Dist Graft lesion before 2nd Mrg is 25% stenosed. The lesion was previously treated. Previously placed stent displays restenosis.       _____________  2D Echocardiogram 5.27.2022   1. Left ventricular ejection fraction, by estimation, is 60 to 65%. The  left ventricle has normal function. The  left ventricle has no regional  wall motion abnormalities. Left ventricular diastolic parameters are  indeterminate.   2. Right ventricular systolic function is normal. The right ventricular  size is normal. Tricuspid regurgitation signal is inadequate for assessing  PA pressure.   3. Left atrial size was moderately dilated.  _____________   Patient Profile     55 y.o.malewith a history of obesity, MI,HTN, CAD, and hyperlipidemia, admitted 5/26 w/ [place], r/o, s/p cath w/ multivessel dzs including graft dzs.  Assessment & Plan    1.  [place]/CAD:  55 y/o w/ a h/o premature CAD s/p first MI @ 80, CABG in 2004, and a reported total of 16 stents in his lifetime. He presented 5/25 w/ chest pain, dyspnea, n, and vomiting, after driving for ~ 5 hrs. He noted that pain was similar to prior angina. Here, High-sensitivity troponinshave been nl despite prolonged symptoms, andEKG did not showany acuteST/T changes (no old EKGs for comparison).Cath w/ severe native multivessel dzs.  patent LIMA  LAD, patent VG  Diag, though diag was occluded, and patent VG  OM2 (site occluded)  OM3.  No interventional targets  med rx recommended. Ranexa increased to 1000mg  BID.  Isordil 10 TID added yesterday.  He has continued to c/o intermittent chest pain and has been requesting and receiving morphine every 3-4 hours.  W/ nl troponins, this is not cardiac c/p.  Discussed this w/ pt.  Very low concern for PE given lack of hypoxia, hemodynamic stability, nl RV on echo.  Cont current regimen.  Re-eval CBC - ? Gi eval if appropriate.  2.  Essential HTN:  Stable.  3.  HL:  Statin intolerant.  Myalgias w/ PCSK9i.  Doesn't want zetia/bempedoic acid.  Will need outpt f/u.  May be a good candidate for inclisiran if he establishes w/ someone @ home in [facility].  4.  Microcytic Anemia/PUD:  H/o ulcers dating back to his teens. Chronic use of asa/plavix. H/H down since admission - 9.2/30.7 on 5/27.  Pt now says that he had melena  earlier this week and coffee ground emesis on the evening of admission.  Will f/u CBC this AM.  Cont PPI.  Consider GI eval if H/H lower (and hold antiplatelets).  Discussed w/ medicine team.  5.  DMII:  New dx.  Mgmt per IM.  Signed, 6/27, NP  06/05/2020, 11:09 AM    For questions or updates, please contact   Please consult [email or web address] for contact info under Cardiology/STEMI.

## 2020-06-05 NOTE — Progress Notes (Signed)
PROGRESS NOTE    Richard Malone  NOM:[id number] DOB: 1965/03/13 DOA: 06/03/2020 PCP: System, Provider Not In    Brief Narrative:  55 year old male with a history of coronary artery disease status post CABG, admitted to the hospital with chest pain.  Concern for unstable angina.  Underwent cardiac cath on 5/26 that did not show any interventional targets.  Recommendations for medical management.   Assessment & Plan:   Active Problems:   Chest pain   CAD (coronary artery disease)   CHF (congestive heart failure) (HCC)   Unstable angina (HCC)   Chest pain with concerns for unstable angina -Cardiac enzymes were negative on admission -Due to significant cardiac history and concerning symptoms, he underwent cardiac cath on 5/26 -Cath showed severe multivessel native disease with no interventional targets.  Recommendations were for medical management -Currently on dual antiplatelet therapy, Ranexa -Started on isosorbide dinitrate -Echo shows normal EF with no wall motion abnormalities  Hypertension -Blood pressure currently stable on metoprolol and lisinopril  Diabetes mellitus, type II -Unclear if this is a new diagnosis since he does report being on Farxiga in the past.  He is also aware of prior elevated A1c -Current A1c of 7.9 -He is agreeable to start glipizide, does not wish to start metformin at this time -blood sugars improving  Hyperlipidemia -Intolerant of statins, does not wish to start Zetia  GERD -Continue on Protonix BID  Microcytic anemia -Hemoglobin has been largely stable -patient does report some dark stools -continue to follow hemoglobin -if he has a significant decline, then may need GI eval -if hgb remains stable, I suspect GI work up could be done as an outpatient -will continue on PPI  Social -patient says that he is unable to go home, since his friends/family that have his keys, wallet and clothes will not be in town until tomorrow. He also does not  have a safe place to go tonight.   DVT prophylaxis: Lovenox  Code Status: Full code Family Communication: Discussed with patient Disposition Plan: Status is: Inpatient  Remains inpatient appropriate because:Inpatient level of care appropriate due to severity of illness   Dispo: The patient is from: Home              Anticipated d/c is to: Home              Patient currently is not medically stable to d/c.   Difficult to place patient No   Consultants:   Cardiology  Procedures:   Cardiac cath 5/26  Antimicrobials:       Subjective: Reports some pain in his right groin from cath. Feels that chest pain is better. He reported to staff that he had some dark stools  Objective: Vitals:   06/05/20 1025 06/05/20 1216 06/05/20 1223 06/05/20 1721  BP: 126/80 (!) 131/110 128/70 (!) 164/79  Pulse: 67 90 67 66  Resp:  12    Temp:  97.6 F (36.4 C)  98 F (36.7 C)  TempSrc:    Oral  SpO2: 99% 99%  100%  Weight:      Height:       No intake or output data in the 24 hours ending 06/05/20 1857 Filed Weights   06/02/20 2237  Weight: 122.5 kg    Examination:  General exam: Alert, awake, oriented x 3 Respiratory system: Clear to auscultation. Respiratory effort normal. Cardiovascular system:RRR. No murmurs, rubs, gallops. Gastrointestinal system: Abdomen is nondistended, soft and nontender. No organomegaly or masses felt. Normal bowel sounds heard.  Central nervous system: Alert and oriented. No focal neurological deficits. Extremities: No C/C/E, +pedal pulses, right groin area without bruising or hematoma Skin: No rashes, lesions or ulcers Psychiatry: Judgement and insight appear normal. Mood & affect appropriate.     Data Reviewed: I have personally reviewed following labs and imaging studies  CBC: Recent Labs  Lab 06/02/20 2242 06/04/20 0056 06/05/20 1201  WBC 8.4 7.2  --   NEUTROABS 5.1  --   --   HGB 10.8* 9.2* 10.5*  HCT 36.2* 30.7* 34.6*  MCV 79.0*  78.1*  --   PLT 338 297  --    Basic Metabolic Panel: Recent Labs  Lab 06/02/20 2242 06/04/20 0056  NA 136 134*  K 3.7 3.7  CL 103 102  CO2 22 24  GLUCOSE 214* 306*  BUN 12 13  CREATININE 0.90 0.94  CALCIUM 8.6* 7.6*   GFR: Estimated Creatinine Clearance: 119.3 mL/min (by C-G formula based on SCr of 0.94 mg/dL). Liver Function Tests: Recent Labs  Lab 06/02/20 2242  AST 17  ALT 13  ALKPHOS 73  BILITOT 0.4  PROT 7.1  ALBUMIN 3.5   No results for input(s): LIPASE, AMYLASE in the last 168 hours. No results for input(s): AMMONIA in the last 168 hours. Coagulation Profile: No results for input(s): INR, PROTIME in the last 168 hours. Cardiac Enzymes: No results for input(s): CKTOTAL, CKMB, CKMBINDEX, TROPONINI in the last 168 hours. BNP (last 3 results) No results for input(s): PROBNP in the last 8760 hours. HbA1C: Recent Labs    06/03/20 1107  HGBA1C 7.9*   CBG: Recent Labs  Lab 06/04/20 1612 06/04/20 2055 06/05/20 0756 06/05/20 1133 06/05/20 1717  GLUCAP 109* 151* 163* 205* 167*   Lipid Profile: Recent Labs    06/04/20 0056  CHOL 152  HDL 34*  LDLCALC 72  TRIG 161*  CHOLHDL 4.5   Thyroid Function Tests: No results for input(s): TSH, T4TOTAL, FREET4, T3FREE, THYROIDAB in the last 72 hours. Anemia Panel: No results for input(s): VITAMINB12, FOLATE, FERRITIN, TIBC, IRON, RETICCTPCT in the last 72 hours. Sepsis Labs: No results for input(s): PROCALCITON, LATICACIDVEN in the last 168 hours.  Recent Results (from the past 240 hour(s))  SARS CORONAVIRUS 2 (TAT 6-24 HRS) Nasopharyngeal Nasopharyngeal Swab     Status: None   Collection Time: 06/03/20  5:37 AM   Specimen: Nasopharyngeal Swab  Result Value Ref Range Status   SARS Coronavirus 2 NEGATIVE NEGATIVE Final    Comment: (NOTE) SARS-CoV-2 target nucleic acids are NOT DETECTED.  The SARS-CoV-2 RNA is generally detectable in upper and lower respiratory specimens during the acute phase of  infection. Negative results do not preclude SARS-CoV-2 infection, do not rule out co-infections with other pathogens, and should not be used as the sole basis for treatment or other patient management decisions. Negative results must be combined with clinical observations, patient history, and epidemiological information. The expected result is Negative.  Fact Sheet for Patients: HairSlick.no  Fact Sheet for Healthcare Providers: quierodirigir.com  This test is not yet approved or cleared by the [place] FDA and  has been authorized for detection and/or diagnosis of SARS-CoV-2 by FDA under an Emergency Use Authorization (EUA). This EUA will remain  in effect (meaning this test can be used) for the duration of the COVID-19 declaration under Se ction 564(b)(1) of the Act, 21 U.S.C. section 360bbb-3(b)(1), unless the authorization is terminated or revoked sooner.  Performed at [facility], [address] [place], [place]  [id number]          Radiology Studies: ECHOCARDIOGRAM COMPLETE  Result Date: 06/04/2020    ECHOCARDIOGRAM REPORT   Patient Name:   Meryl CrutchWILLIAM Stukey Date of Exam: 06/04/2020 Medical Rec #:  [id number]     Height:       70.0 in Accession #:    [id number]    Weight:       270.0 lb Date of Birth:  1965/01/23     BSA:          2.371 m Patient Age:    54 years      BP:           120/79 mmHg Patient Gender: M             HR:           68 bpm. Exam Location:  [facility] Procedure: 2D Echo, Cardiac Doppler and Color Doppler Indications:     Chest pain R07.9  History:         Patient has no prior history of Echocardiogram examinations.                  Previous Myocardial Infarction; Risk Factors:Hypertension.  Sonographer:     Cristela BlueJerry Hege RDCS (AE) Referring Phys:  [id number] CHRISTOPHER END Diagnosing Phys: Julien Nordmannimothy Gollan MD  Sonographer Comments: Technically challenging study due to limited acoustic windows, no apical window and  no subcostal window. IMPRESSIONS  1. Left ventricular ejection fraction, by estimation, is 60 to 65%. The left ventricle has normal function. The left ventricle has no regional wall motion abnormalities. Left ventricular diastolic parameters are indeterminate.  2. Right ventricular systolic function is normal. The right ventricular size is normal. Tricuspid regurgitation signal is inadequate for assessing PA pressure.  3. Left atrial size was moderately dilated. FINDINGS  Left Ventricle: Left ventricular ejection fraction, by estimation, is 60 to 65%. The left ventricle has normal function. The left ventricle has no regional wall motion abnormalities. The left ventricular internal cavity size was normal in size. There is  no left ventricular hypertrophy. Left ventricular diastolic parameters are indeterminate. Right Ventricle: The right ventricular size is normal. No increase in right ventricular wall thickness. Right ventricular systolic function is normal. Tricuspid regurgitation signal is inadequate for assessing PA pressure. Left Atrium: Left atrial size was moderately dilated. Right Atrium: Right atrial size was normal in size. Pericardium: There is no evidence of pericardial effusion. Mitral Valve: The mitral valve was not well visualized. No evidence of mitral valve regurgitation. No evidence of mitral valve stenosis. Tricuspid Valve: The tricuspid valve is not well visualized. Tricuspid valve regurgitation is not demonstrated. No evidence of tricuspid stenosis. Aortic Valve: The aortic valve was not well visualized. Aortic valve regurgitation is not visualized. No aortic stenosis is present. Pulmonic Valve: The pulmonic valve was not well visualized. Pulmonic valve regurgitation is not visualized. No evidence of pulmonic stenosis. Aorta: The aortic root is normal in size and structure. Venous: The inferior vena cava is normal in size with greater than 50% respiratory variability, suggesting right atrial  pressure of 3 mmHg. IAS/Shunts: No atrial level shunt detected by color flow Doppler.  LEFT VENTRICLE PLAX 2D LVIDd:         5.48 cm LVIDs:         3.89 cm LV PW:         1.32 cm LV IVS:        0.99 cm LVOT diam:     2.20 cm LVOT  Area:     3.80 cm  LEFT ATRIUM         Index LA diam:    6.00 cm 2.53 cm/m                        PULMONIC VALVE AORTA                 PV Vmax:        0.87 m/s Ao Root diam: 3.27 cm PV Peak grad:   3.0 mmHg                       RVOT Peak grad: 2 mmHg   SHUNTS Systemic Diam: 2.20 cm Julien Nordmann MD Electronically signed by Julien Nordmann MD Signature Date/Time: 06/04/2020/7:07:49 PM    Final         Scheduled Meds: . aspirin  81 mg Oral Daily  . clopidogrel  75 mg Oral Daily  . glipiZIDE  5 mg Oral QAC breakfast  . insulin aspart  0-15 Units Subcutaneous TID WC  . insulin aspart  0-5 Units Subcutaneous QHS  . isosorbide dinitrate  10 mg Oral TID  . lisinopril  5 mg Oral Daily  . metoprolol succinate  25 mg Oral BID  . pantoprazole  40 mg Oral BID AC  . ranolazine  1,000 mg Oral BID  . sodium chloride flush  3 mL Intravenous Q12H  . sodium chloride flush  3 mL Intravenous Q12H   Continuous Infusions: . sodium chloride       LOS: 1 day    Time spent: 35 mins    Erick Blinks, MD [facility]   If 7PM-7AM, please contact night-coverage [email or web address]  06/05/2020, 6:57 PM

## 2020-06-05 NOTE — Plan of Care (Signed)
  Problem: Education: Goal: Knowledge of General Education information will improve Description: Including pain rating scale, medication(s)/side effects and non-pharmacologic comfort measures Outcome: Progressing   Problem: Clinical Measurements: Goal: Ability to maintain clinical measurements within normal limits will improve Outcome: Progressing Goal: Will remain free from infection Outcome: Progressing Goal: Diagnostic test results will improve Outcome: Progressing   Problem: Activity: Goal: Risk for activity intolerance will decrease Outcome: Progressing   Problem: Coping: Goal: Level of anxiety will decrease Outcome: Progressing

## 2020-06-06 DIAGNOSIS — E782 Mixed hyperlipidemia: Secondary | ICD-10-CM

## 2020-06-06 DIAGNOSIS — Z9114 Patient's other noncompliance with medication regimen: Secondary | ICD-10-CM

## 2020-06-06 LAB — GLUCOSE, CAPILLARY
Glucose-Capillary: 148 mg/dL — ABNORMAL HIGH (ref 70–99)
Glucose-Capillary: 140 mg/dL — ABNORMAL HIGH (ref 70–99)
Glucose-Capillary: 134 mg/dL — ABNORMAL HIGH (ref 70–99)
Glucose-Capillary: 137 mg/dL — ABNORMAL HIGH (ref 70–99)

## 2020-06-06 LAB — HEMOGLOBIN AND HEMATOCRIT, BLOOD
HCT: 30.9 % — ABNORMAL LOW (ref 39.0–52.0)
Hemoglobin: 9.5 g/dL — ABNORMAL LOW (ref 13.0–17.0)

## 2020-06-06 NOTE — Progress Notes (Signed)
PROGRESS NOTE    Richard Malone  JEH:[id number] DOB: 1965/09/16 DOA: 06/03/2020 PCP: System, Provider Not In    Brief Narrative:  55 year old male with a history of coronary artery disease status post CABG, admitted to the hospital with chest pain.  Concern for unstable angina.  Underwent cardiac cath on 5/26 that did not show any interventional targets.  Recommendations for medical management.   Assessment & Plan:   Active Problems:   Chest pain   CAD (coronary artery disease)   CHF (congestive heart failure) (HCC)   Unstable angina (HCC)   Chest pain with concerns for unstable angina -Cardiac enzymes were negative on admission -Due to significant cardiac history and concerning symptoms, he underwent cardiac cath on 5/26 -Cath showed severe multivessel native disease with no interventional targets.  Recommendations were for medical management -Currently on dual antiplatelet therapy, Ranexa -Started on isosorbide dinitrate -Echo shows normal EF with no wall motion abnormalities  Hypertension -Blood pressure currently stable on metoprolol and lisinopril  Diabetes mellitus, type II -Unclear if this is a new diagnosis since he does report being on Farxiga in the past.  He is also aware of prior elevated A1c -Current A1c of 7.9 -He is agreeable to start glipizide, does not wish to start metformin at this time -blood sugars improving  Hyperlipidemia -Intolerant of statins, does not wish to start Zetia  GERD -Continue on Protonix BID  Microcytic anemia with concerns for GI bleed -Hemoglobin ranging between 9-10 since admission -patient does report some dark stools, staff had noted one BM with fresh blood -continue to follow hemoglobin -since he is having abd pain and nausea, will change diet to clear liquids -will continue on PPI -Discussed case with Dr. Norma Fredrickson, GI, who will see in consult  Social -patient says that he is unable to go home, since his friends/family that  have his keys, wallet and clothes will not be in town until tomorrow. He also does not have a safe place to go tonight.   DVT prophylaxis: SCDs  Code Status: Full code Family Communication: Discussed with patient Disposition Plan: Status is: Inpatient  Remains inpatient appropriate because:Inpatient level of care appropriate due to severity of illness   Dispo: The patient is from: Home              Anticipated d/c is to: Home              Patient currently is not medically stable to d/c.   Difficult to place patient No   Consultants:   Cardiology  Procedures:   Cardiac cath 5/26  Antimicrobials:       Subjective: Reports that chest pain has resolved. He is complaining of lower abdominal pain since yesterday. Said he was unable to eat breakfast due to vomiting and has been nauseous since then.   Objective: Vitals:   06/05/20 2048 06/06/20 0552 06/06/20 0732 06/06/20 1210  BP: (!) 160/94 110/69 (!) 141/79 (!) 149/90  Pulse: 64 61 68 64  Resp: 18 18 17 18   Temp: 98.6 F (37 C) 98.1 F (36.7 C) 98.2 F (36.8 C) 98.1 F (36.7 C)  TempSrc: Oral Oral Oral Oral  SpO2: 99% 96% 97% 99%  Weight:      Height:        Intake/Output Summary (Last 24 hours) at 06/06/2020 1401 Last data filed at 06/06/2020 0745 Gross per 24 hour  Intake 240 ml  Output --  Net 240 ml   Filed Weights   06/02/20 2237  Weight: 122.5 kg    Examination:  General exam: Alert, awake, oriented x 3 Respiratory system: Clear to auscultation. Respiratory effort normal. Cardiovascular system:RRR. No murmurs, rubs, gallops. Gastrointestinal system: Abdomen is nondistended, soft and tender in lower abdomen. No organomegaly or masses felt. Normal bowel sounds heard. Central nervous system: Alert and oriented. No focal neurological deficits. Extremities: No C/C/E, +pedal pulses Skin: No rashes, lesions or ulcers Psychiatry: Judgement and insight appear normal. Mood & affect appropriate.       Data Reviewed: I have personally reviewed following labs and imaging studies  CBC: Recent Labs  Lab 06/02/20 2242 06/04/20 0056 06/05/20 1201 06/06/20 0425  WBC 8.4 7.2  --   --   NEUTROABS 5.1  --   --   --   HGB 10.8* 9.2* 10.5* 9.5*  HCT 36.2* 30.7* 34.6* 30.9*  MCV 79.0* 78.1*  --   --   PLT 338 297  --   --    Basic Metabolic Panel: Recent Labs  Lab 06/02/20 2242 06/04/20 0056  NA 136 134*  K 3.7 3.7  CL 103 102  CO2 22 24  GLUCOSE 214* 306*  BUN 12 13  CREATININE 0.90 0.94  CALCIUM 8.6* 7.6*   GFR: Estimated Creatinine Clearance: 119.3 mL/min (by C-G formula based on SCr of 0.94 mg/dL). Liver Function Tests: Recent Labs  Lab 06/02/20 2242  AST 17  ALT 13  ALKPHOS 73  BILITOT 0.4  PROT 7.1  ALBUMIN 3.5   No results for input(s): LIPASE, AMYLASE in the last 168 hours. No results for input(s): AMMONIA in the last 168 hours. Coagulation Profile: No results for input(s): INR, PROTIME in the last 168 hours. Cardiac Enzymes: No results for input(s): CKTOTAL, CKMB, CKMBINDEX, TROPONINI in the last 168 hours. BNP (last 3 results) No results for input(s): PROBNP in the last 8760 hours. HbA1C: No results for input(s): HGBA1C in the last 72 hours. CBG: Recent Labs  Lab 06/05/20 1133 06/05/20 1717 06/05/20 1941 06/06/20 0735 06/06/20 1210  GLUCAP 205* 167* 159* 148* 140*   Lipid Profile: Recent Labs    06/04/20 0056  CHOL 152  HDL 34*  LDLCALC 72  TRIG 254*  CHOLHDL 4.5   Thyroid Function Tests: No results for input(s): TSH, T4TOTAL, FREET4, T3FREE, THYROIDAB in the last 72 hours. Anemia Panel: No results for input(s): VITAMINB12, FOLATE, FERRITIN, TIBC, IRON, RETICCTPCT in the last 72 hours. Sepsis Labs: No results for input(s): PROCALCITON, LATICACIDVEN in the last 168 hours.  Recent Results (from the past 240 hour(s))  SARS CORONAVIRUS 2 (TAT 6-24 HRS) Nasopharyngeal Nasopharyngeal Swab     Status: None   Collection Time:  06/03/20  5:37 AM   Specimen: Nasopharyngeal Swab  Result Value Ref Range Status   SARS Coronavirus 2 NEGATIVE NEGATIVE Final    Comment: (NOTE) SARS-CoV-2 target nucleic acids are NOT DETECTED.  The SARS-CoV-2 RNA is generally detectable in upper and lower respiratory specimens during the acute phase of infection. Negative results do not preclude SARS-CoV-2 infection, do not rule out co-infections with other pathogens, and should not be used as the sole basis for treatment or other patient management decisions. Negative results must be combined with clinical observations, patient history, and epidemiological information. The expected result is Negative.  Fact Sheet for Patients: HairSlick.no  Fact Sheet for Healthcare Providers: quierodirigir.com  This test is not yet approved or cleared by the [place] FDA and  has been authorized for detection and/or diagnosis of SARS-CoV-2 by FDA under  an Emergency Use Authorization (EUA). This EUA will remain  in effect (meaning this test can be used) for the duration of the COVID-19 declaration under Se ction 564(b)(1) of the Act, 21 U.S.C. section 360bbb-3(b)(1), unless the authorization is terminated or revoked sooner.  Performed at [facility], [address], [place], [place] [address]          Radiology Studies: No results found.      Scheduled Meds: . aspirin  81 mg Oral Daily  . clopidogrel  75 mg Oral Daily  . glipiZIDE  5 mg Oral QAC breakfast  . insulin aspart  0-15 Units Subcutaneous TID WC  . insulin aspart  0-5 Units Subcutaneous QHS  . isosorbide dinitrate  10 mg Oral TID  . lisinopril  5 mg Oral Daily  . metoprolol succinate  25 mg Oral BID  . pantoprazole  40 mg Oral BID AC  . ranolazine  1,000 mg Oral BID  . sodium chloride flush  3 mL Intravenous Q12H  . sodium chloride flush  3 mL Intravenous Q12H   Continuous Infusions: . sodium  chloride       LOS: 2 days    Time spent: 35 mins    Erick Blinks, MD [facility]   If 7PM-7AM, please contact night-coverage [email or web address]  06/06/2020, 2:01 PM

## 2020-06-06 NOTE — Consult Note (Addendum)
[facility] GI Inpatient Consult Note   Richard Malone, M.D.  Reason for Consult: Periumbilical abdominal pain, GI bleeding. Nausea and vomiting.   Attending Requesting Consult: Erick Blinks, M.D.  Outpatient Primary Physician: N/A  History of Present Illness: Richard Malone is a 55 y.o. male in the area visiting, residing in [place], [place]. Admitted 5/25 for symptoms compatible with unstable angina. Underwent left heart cath on 06/03/2020 with severe multivessel disease without need for PTCA or stent placement. Placed on Plavix 75mg  daily beginning on 06/03/20. Has poorly controlled Type II DM and hyperlipidemia accompanied by medical noncompliance with recommended pharmacotherapy and lifestyle changes. GI has been asked to see patient in regards to complaints of lower abdominal discomfort with reported nausea and vomiting of solid food diet. He also c/o melena not witnessed by nursing staff. RN did report, however, witnessing some "fresh red blood" in toilet this AM. Patient's labs and vitals have remained stable with Hgb fluctuating up and down between 9.2 and 10.8 since hospital admission. Occult blood testing of stool is currently pending. 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 Patient reports previous history of "bleeding ulcers" as a child at age 32 and reportedly underwent an endoscopy at that time. He reports daily use of ASA but no prescription anticoagulants until recent Plavix. He is naive to lower GI luminal evaluation.  Patient says currently, he denies DOE, SOB at rest, Chest pain, palpitations, diaphoresis, dizziness, syncope or presyncopal episodes.   Past Medical History:  Past Medical History:  Diagnosis Date  . CAD (coronary artery disease)    a. s/p first MI @ age 64; b. 2004 s/p CABG @ [facility]; c. reports h/o 16 stents; d. last cath ~ 4  yrs ago in 2005.  [place] HTN (hypertension)   . Hyperlipidemia LDL goal <70    a. Statin/PCSK9i intolerant. Does not want to try alternate agents.  . MI (myocardial infarction) (HCC)   . Obesity     Problem List: Patient Active Problem List   Diagnosis Date Noted  . Chest pain 06/03/2020  . CAD (coronary artery disease) 06/03/2020  . CHF (congestive heart failure) (HCC) 06/03/2020  . Unstable angina [facility])     Past Surgical History: Past Surgical History:  Procedure Laterality Date  . CHOLECYSTECTOMY    . CORONARY ARTERY BYPASS GRAFT    . LEFT HEART CATH AND CORS/GRAFTS ANGIOGRAPHY N/A 06/03/2020   Procedure: LEFT HEART CATH AND CORS/GRAFTS ANGIOGRAPHY;  Surgeon: 06/05/2020, MD;  Location: [facility] INVASIVE CV LAB;  Service: Cardiovascular;  Laterality: N/A;  . THORACIC AORTOGRAM N/A 06/03/2020   Procedure: THORACIC AORTOGRAM;  Surgeon: 06/05/2020, MD;  Location: [facility] INVASIVE CV LAB;  Service: Cardiovascular;  Laterality: N/A;  . TONSILLECTOMY      Allergies: Allergies  Allergen Reactions  . Drug Ingredient [Atorvastatin]     Home Medications: Medications Prior to Admission  Medication Sig Dispense Refill Last Dose  . aspirin 325 MG tablet Take 325 mg by mouth daily.   06/02/2020 at Unknown time  . clopidogrel (PLAVIX) 75 MG tablet Take 1 tablet by mouth daily.   06/02/2020 at Unknown time  . lisinopril (ZESTRIL) 10 MG tablet Take 1 tablet by mouth daily.   06/02/2020 at Unknown time  . metoprolol succinate (TOPROL-XL) 25 MG 24 hr tablet Take 1 tablet by mouth in the morning and at  bedtime.   06/02/2020 at Unknown time  . pantoprazole (PROTONIX) 40 MG tablet Take 1 tablet by mouth daily.   06/02/2020 at Unknown time  . ranolazine (RANEXA) 500 MG 12 hr tablet Take 500 mg by mouth 2 (two) times daily.   06/02/2020 at Unknown time   Home medication reconciliation was completed with the patient.   Scheduled Inpatient Medications:   . aspirin  81 mg Oral Daily  . clopidogrel  75  mg Oral Daily  . glipiZIDE  5 mg Oral QAC breakfast  . insulin aspart  0-15 Units Subcutaneous TID WC  . insulin aspart  0-5 Units Subcutaneous QHS  . isosorbide dinitrate  10 mg Oral TID  . lisinopril  5 mg Oral Daily  . metoprolol succinate  25 mg Oral BID  . pantoprazole  40 mg Oral BID AC  . ranolazine  1,000 mg Oral BID  . sodium chloride flush  3 mL Intravenous Q12H  . sodium chloride flush  3 mL Intravenous Q12H    Continuous Inpatient Infusions:   . sodium chloride      PRN Inpatient Medications:  sodium chloride, acetaminophen, nitroGLYCERIN, ondansetron (ZOFRAN) IV, oxyCODONE-acetaminophen, sodium chloride flush  Family History: family history includes CAD in his father, mother, and sister; Cancer in his sister.   GI Family History: Negative  Social History:   reports that he has quit smoking. His smoking use included cigarettes. He has a 15.00 pack-year smoking history. He has never used smokeless tobacco. He reports previous alcohol use. He reports that he does not use drugs. The patient denies ETOH, tobacco, or drug use.    Review of Systems: Review of Systems - Negative except that in HPI  Physical Examination: BP (!) 149/90 (BP Location: Right Wrist)   Pulse 64   Temp 98.1 F (36.7 C) (Oral)   Resp 18   Ht 5\' 10"  (1.778 m)   Wt 122.5 kg   SpO2 99%   BMI 38.74 kg/m  Physical Exam Vitals and nursing note reviewed.  Constitutional:      General: He is not in acute distress.    Appearance: He is well-developed. He is obese. He is not ill-appearing or toxic-appearing.  HENT:     Head: Normocephalic and atraumatic.  Eyes:     Extraocular Movements: Extraocular movements intact.     Pupils: Pupils are equal, round, and reactive to light.  Neck:     Thyroid: No thyromegaly.  Cardiovascular:     Rate and Rhythm: Normal rate.  No extrasystoles are present.    Pulses:          Carotid pulses are 2+ on the right side and 2+ on the left side.      Radial  pulses are 2+ on the right side and 2+ on the left side.       Dorsalis pedis pulses are 2+ on the right side and 2+ on the left side.       Posterior tibial pulses are 2+ on the right side and 2+ on the left side.     Heart sounds: Normal heart sounds. No S4 sounds.   Pulmonary:     Effort: Pulmonary effort is normal. No tachypnea, accessory muscle usage or respiratory distress.     Breath sounds: Normal breath sounds. No stridor.  Abdominal:     General: Bowel sounds are normal. There is no abdominal bruit.     Palpations: Abdomen is soft. There is no fluid wave.  Tenderness: There is abdominal tenderness in the periumbilical area. There is no guarding or rebound.     Hernia: No hernia is present.  Musculoskeletal:     Right shoulder: Normal.     Left shoulder: Normal.     Right upper arm: Normal.     Left upper arm: Normal.     Right elbow: Normal.     Left elbow: Normal.     Right forearm: Normal.     Left forearm: Normal.     Right wrist: Normal.     Left wrist: Normal.     Right hand: Normal.     Left hand: Normal.     Cervical back: Normal range of motion.     Right lower leg: No edema.     Left lower leg: No edema.  Lymphadenopathy:     Head:     Right side of head: No submandibular adenopathy.     Left side of head: No submandibular adenopathy.     Cervical: No cervical adenopathy.  Skin:    General: Skin is warm and dry.     Capillary Refill: Capillary refill takes less than 2 seconds.     Findings: No rash or wound.     Nails: There is no clubbing.  Neurological:     Mental Status: He is alert and oriented to person, place, and time.     Cranial Nerves: Cranial nerves are intact.     Sensory: Sensation is intact.     Motor: Motor function is intact.     Coordination: Coordination is intact.     Gait: Gait is intact.  Psychiatric:        Attention and Perception: Attention and perception normal.        Mood and Affect: Mood and affect normal.         Speech: Speech normal.        Behavior: Behavior normal. Behavior is cooperative.        Thought Content: Thought content normal.        Cognition and Memory: Cognition and memory normal.        Judgment: Judgment normal.     Data: Lab Results  Component Value Date   WBC 7.2 06/04/2020   HGB 9.5 (L) 06/06/2020   HCT 30.9 (L) 06/06/2020   MCV 78.1 (L) 06/04/2020   PLT 297 06/04/2020   Recent Labs  Lab 06/04/20 0056 06/05/20 1201 06/06/20 0425  HGB 9.2* 10.5* 9.5*   Lab Results  Component Value Date   NA 134 (L) 06/04/2020   K 3.7 06/04/2020   CL 102 06/04/2020   CO2 24 06/04/2020   BUN 13 06/04/2020   CREATININE 0.94 06/04/2020   Lab Results  Component Value Date   ALT 13 06/02/2020   AST 17 06/02/2020   ALKPHOS 73 06/02/2020   BILITOT 0.4 06/02/2020   No results for input(s): APTT, INR, PTT in the last 168 hours. CBC Latest Ref Rng & Units 06/06/2020 06/05/2020 06/04/2020  WBC 4.0 - 10.5 K/uL - - 7.2  Hemoglobin 13.0 - 17.0 g/dL 0.0(F) 10.5(L) 9.2(L)  Hematocrit 39.0 - 52.0 % 30.9(L) 34.6(L) 30.7(L)  Platelets 150 - 400 K/uL - - 297    STUDIES: No results found. @IMAGES @  Assessment: 1. Abdominal pain - Periumbilical. Differential diagnosis includes peptic ulcer disease, mesenteric ischemia, right colonic bleed (malignancy, colitis, etc.). Currently stable without peritoneal signs. 2. Regular chemoprevention with daily ASA. Risk factor for peptic ulcer disease, nsaid enteropathy.  3. Morbid obesity. 4. Gastrointestinal bleeding - Likely UGI source given initial melena, nausea and vomiting symptoms. 5. Severe multivessel CAD - Stable on medical therapy. 6. Active anticoagulation therapy with Plavix. This prevents a potential issue with increased bleeding risk for endoscopic procedures, but is deemed manageable given clinical presentation and need to clinically assess risk for significant gastrointestinal bleeding in the future.  COVID-19 status: Tested  negative.  Recommendations: 1. Continue IVF's, transfuse as needed. 2. Continue IV PPI. 3. Serial H/H. 4. Plan EGD tomorrow ON Plavix. The patient understands the nature of the planned procedure, indications, risks, alternatives and potential complications including but not limited to bleeding, infection, perforation, damage to internal organs and possible oversedation/side effects from anesthesia. The patient agrees and gives consent to proceed.  Please refer to procedure notes for findings, recommendations and patient disposition/instructions.   Thank you for the consult. Please call with questions or concerns.  Rosina Lowensteinoledo, Sonna Lipsky, "Mellody DanceKeith" MD [facility] [address] [place], [place] [address] [phone]  06/06/2020 2:05 PM

## 2020-06-06 NOTE — H&P (View-Only) (Signed)
[facility] GI Inpatient Consult Note   Richard Malone, M.D.  Reason for Consult: Periumbilical abdominal pain, GI bleeding. Nausea and vomiting.   Attending Requesting Consult: Erick Blinks, M.D.  Outpatient Primary Physician: N/A  History of Present Illness: Richard Malone is a 55 y.o. male in the area visiting, residing in [place], [place]. Admitted 5/25 for symptoms compatible with unstable angina. Underwent left heart cath on 06/03/2020 with severe multivessel disease without need for PTCA or stent placement. Placed on Plavix 75mg  daily beginning on 06/03/20. Has poorly controlled Type II DM and hyperlipidemia accompanied by medical noncompliance with recommended pharmacotherapy and lifestyle changes. GI has been asked to see patient in regards to complaints of lower abdominal discomfort with reported nausea and vomiting of solid food diet. He also c/o melena not witnessed by nursing staff. RN did report, however, witnessing some "fresh red blood" in toilet this AM. Patient's labs and vitals have remained stable with Hgb fluctuating up and down between 9.2 and 10.8 since hospital admission. Occult blood testing of stool is currently pending. 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 Patient reports previous history of "bleeding ulcers" as a child at age 32 and reportedly underwent an endoscopy at that time. He reports daily use of ASA but no prescription anticoagulants until recent Plavix. He is naive to lower GI luminal evaluation.  Patient says currently, he denies DOE, SOB at rest, Chest pain, palpitations, diaphoresis, dizziness, syncope or presyncopal episodes.   Past Medical History:  Past Medical History:  Diagnosis Date  . CAD (coronary artery disease)    a. s/p first MI @ age 64; b. 2004 s/p CABG @ [facility]; c. reports h/o 16 stents; d. last cath ~ 4  yrs ago in 2005.  [place] HTN (hypertension)   . Hyperlipidemia LDL goal <70    a. Statin/PCSK9i intolerant. Does not want to try alternate agents.  . MI (myocardial infarction) (HCC)   . Obesity     Problem List: Patient Active Problem List   Diagnosis Date Noted  . Chest pain 06/03/2020  . CAD (coronary artery disease) 06/03/2020  . CHF (congestive heart failure) (HCC) 06/03/2020  . Unstable angina [facility])     Past Surgical History: Past Surgical History:  Procedure Laterality Date  . CHOLECYSTECTOMY    . CORONARY ARTERY BYPASS GRAFT    . LEFT HEART CATH AND CORS/GRAFTS ANGIOGRAPHY N/A 06/03/2020   Procedure: LEFT HEART CATH AND CORS/GRAFTS ANGIOGRAPHY;  Surgeon: 06/05/2020, MD;  Location: [facility] INVASIVE CV LAB;  Service: Cardiovascular;  Laterality: N/A;  . THORACIC AORTOGRAM N/A 06/03/2020   Procedure: THORACIC AORTOGRAM;  Surgeon: 06/05/2020, MD;  Location: [facility] INVASIVE CV LAB;  Service: Cardiovascular;  Laterality: N/A;  . TONSILLECTOMY      Allergies: Allergies  Allergen Reactions  . Drug Ingredient [Atorvastatin]     Home Medications: Medications Prior to Admission  Medication Sig Dispense Refill Last Dose  . aspirin 325 MG tablet Take 325 mg by mouth daily.   06/02/2020 at Unknown time  . clopidogrel (PLAVIX) 75 MG tablet Take 1 tablet by mouth daily.   06/02/2020 at Unknown time  . lisinopril (ZESTRIL) 10 MG tablet Take 1 tablet by mouth daily.   06/02/2020 at Unknown time  . metoprolol succinate (TOPROL-XL) 25 MG 24 hr tablet Take 1 tablet by mouth in the morning and at  bedtime.   06/02/2020 at Unknown time  . pantoprazole (PROTONIX) 40 MG tablet Take 1 tablet by mouth daily.   06/02/2020 at Unknown time  . ranolazine (RANEXA) 500 MG 12 hr tablet Take 500 mg by mouth 2 (two) times daily.   06/02/2020 at Unknown time   Home medication reconciliation was completed with the patient.   Scheduled Inpatient Medications:   . aspirin  81 mg Oral Daily  . clopidogrel  75  mg Oral Daily  . glipiZIDE  5 mg Oral QAC breakfast  . insulin aspart  0-15 Units Subcutaneous TID WC  . insulin aspart  0-5 Units Subcutaneous QHS  . isosorbide dinitrate  10 mg Oral TID  . lisinopril  5 mg Oral Daily  . metoprolol succinate  25 mg Oral BID  . pantoprazole  40 mg Oral BID AC  . ranolazine  1,000 mg Oral BID  . sodium chloride flush  3 mL Intravenous Q12H  . sodium chloride flush  3 mL Intravenous Q12H    Continuous Inpatient Infusions:   . sodium chloride      PRN Inpatient Medications:  sodium chloride, acetaminophen, nitroGLYCERIN, ondansetron (ZOFRAN) IV, oxyCODONE-acetaminophen, sodium chloride flush  Family History: family history includes CAD in his father, mother, and sister; Cancer in his sister.   GI Family History: Negative  Social History:   reports that he has quit smoking. His smoking use included cigarettes. He has a 15.00 pack-year smoking history. He has never used smokeless tobacco. He reports previous alcohol use. He reports that he does not use drugs. The patient denies ETOH, tobacco, or drug use.    Review of Systems: Review of Systems - Negative except that in HPI  Physical Examination: BP (!) 149/90 (BP Location: Right Wrist)   Pulse 64   Temp 98.1 F (36.7 C) (Oral)   Resp 18   Ht 5\' 10"  (1.778 m)   Wt 122.5 kg   SpO2 99%   BMI 38.74 kg/m  Physical Exam Vitals and nursing note reviewed.  Constitutional:      General: He is not in acute distress.    Appearance: He is well-developed. He is obese. He is not ill-appearing or toxic-appearing.  HENT:     Head: Normocephalic and atraumatic.  Eyes:     Extraocular Movements: Extraocular movements intact.     Pupils: Pupils are equal, round, and reactive to light.  Neck:     Thyroid: No thyromegaly.  Cardiovascular:     Rate and Rhythm: Normal rate.  No extrasystoles are present.    Pulses:          Carotid pulses are 2+ on the right side and 2+ on the left side.      Radial  pulses are 2+ on the right side and 2+ on the left side.       Dorsalis pedis pulses are 2+ on the right side and 2+ on the left side.       Posterior tibial pulses are 2+ on the right side and 2+ on the left side.     Heart sounds: Normal heart sounds. No S4 sounds.   Pulmonary:     Effort: Pulmonary effort is normal. No tachypnea, accessory muscle usage or respiratory distress.     Breath sounds: Normal breath sounds. No stridor.  Abdominal:     General: Bowel sounds are normal. There is no abdominal bruit.     Palpations: Abdomen is soft. There is no fluid wave.  Tenderness: There is abdominal tenderness in the periumbilical area. There is no guarding or rebound.     Hernia: No hernia is present.  Musculoskeletal:     Right shoulder: Normal.     Left shoulder: Normal.     Right upper arm: Normal.     Left upper arm: Normal.     Right elbow: Normal.     Left elbow: Normal.     Right forearm: Normal.     Left forearm: Normal.     Right wrist: Normal.     Left wrist: Normal.     Right hand: Normal.     Left hand: Normal.     Cervical back: Normal range of motion.     Right lower leg: No edema.     Left lower leg: No edema.  Lymphadenopathy:     Head:     Right side of head: No submandibular adenopathy.     Left side of head: No submandibular adenopathy.     Cervical: No cervical adenopathy.  Skin:    General: Skin is warm and dry.     Capillary Refill: Capillary refill takes less than 2 seconds.     Findings: No rash or wound.     Nails: There is no clubbing.  Neurological:     Mental Status: He is alert and oriented to person, place, and time.     Cranial Nerves: Cranial nerves are intact.     Sensory: Sensation is intact.     Motor: Motor function is intact.     Coordination: Coordination is intact.     Gait: Gait is intact.  Psychiatric:        Attention and Perception: Attention and perception normal.        Mood and Affect: Mood and affect normal.         Speech: Speech normal.        Behavior: Behavior normal. Behavior is cooperative.        Thought Content: Thought content normal.        Cognition and Memory: Cognition and memory normal.        Judgment: Judgment normal.     Data: Lab Results  Component Value Date   WBC 7.2 06/04/2020   HGB 9.5 (L) 06/06/2020   HCT 30.9 (L) 06/06/2020   MCV 78.1 (L) 06/04/2020   PLT 297 06/04/2020   Recent Labs  Lab 06/04/20 0056 06/05/20 1201 06/06/20 0425  HGB 9.2* 10.5* 9.5*   Lab Results  Component Value Date   NA 134 (L) 06/04/2020   K 3.7 06/04/2020   CL 102 06/04/2020   CO2 24 06/04/2020   BUN 13 06/04/2020   CREATININE 0.94 06/04/2020   Lab Results  Component Value Date   ALT 13 06/02/2020   AST 17 06/02/2020   ALKPHOS 73 06/02/2020   BILITOT 0.4 06/02/2020   No results for input(s): APTT, INR, PTT in the last 168 hours. CBC Latest Ref Rng & Units 06/06/2020 06/05/2020 06/04/2020  WBC 4.0 - 10.5 K/uL - - 7.2  Hemoglobin 13.0 - 17.0 g/dL 0.0(F) 10.5(L) 9.2(L)  Hematocrit 39.0 - 52.0 % 30.9(L) 34.6(L) 30.7(L)  Platelets 150 - 400 K/uL - - 297    STUDIES: No results found. @IMAGES @  Assessment: 1. Abdominal pain - Periumbilical. Differential diagnosis includes peptic ulcer disease, mesenteric ischemia, right colonic bleed (malignancy, colitis, etc.). Currently stable without peritoneal signs. 2. Regular chemoprevention with daily ASA. Risk factor for peptic ulcer disease, nsaid enteropathy.  3. Morbid obesity. 4. Gastrointestinal bleeding - Likely UGI source given initial melena, nausea and vomiting symptoms. 5. Severe multivessel CAD - Stable on medical therapy. 6. Active anticoagulation therapy with Plavix. This prevents a potential issue with increased bleeding risk for endoscopic procedures, but is deemed manageable given clinical presentation and need to clinically assess risk for significant gastrointestinal bleeding in the future.  COVID-19 status: Tested  negative.  Recommendations: 1. Continue IVF's, transfuse as needed. 2. Continue IV PPI. 3. Serial H/H. 4. Plan EGD tomorrow ON Plavix. The patient understands the nature of the planned procedure, indications, risks, alternatives and potential complications including but not limited to bleeding, infection, perforation, damage to internal organs and possible oversedation/side effects from anesthesia. The patient agrees and gives consent to proceed.  Please refer to procedure notes for findings, recommendations and patient disposition/instructions.   Thank you for the consult. Please call with questions or concerns.  Hridhaan Yohn, "Keith" MD [facility] [address] [place], [place] [address] [phone]  06/06/2020 2:05 PM     

## 2020-06-07 ENCOUNTER — Inpatient Hospital Stay: Payer: Self-pay | Admitting: Anesthesiology

## 2020-06-07 ENCOUNTER — Encounter
Admission: EM | Disposition: A | Discharge status: Home / Self Care | Payer: Self-pay | Source: Home / Self Care | Attending: Internal Medicine

## 2020-06-07 ENCOUNTER — Encounter: Payer: Self-pay | Admitting: Internal Medicine

## 2020-06-07 DIAGNOSIS — R195 Other fecal abnormalities: Secondary | ICD-10-CM

## 2020-06-07 DIAGNOSIS — D509 Iron deficiency anemia, unspecified: Secondary | ICD-10-CM

## 2020-06-07 HISTORY — PX: ESOPHAGOGASTRODUODENOSCOPY (EGD) WITH PROPOFOL: SHX5813

## 2020-06-07 LAB — CBC
HCT: 33.5 % — ABNORMAL LOW (ref 39.0–52.0)
Hemoglobin: 10 g/dL — ABNORMAL LOW (ref 13.0–17.0)
MCH: 23.1 pg — ABNORMAL LOW (ref 26.0–34.0)
MCHC: 29.9 g/dL — ABNORMAL LOW (ref 30.0–36.0)
MCV: 77.5 fL — ABNORMAL LOW (ref 80.0–100.0)
Platelets: 312 10*3/uL (ref 150–400)
RBC: 4.32 MIL/uL (ref 4.22–5.81)
RDW: 17.5 % — ABNORMAL HIGH (ref 11.5–15.5)
WBC: 8.5 10*3/uL (ref 4.0–10.5)
nRBC: 0 % (ref 0.0–0.2)

## 2020-06-07 LAB — GLUCOSE, CAPILLARY
Glucose-Capillary: 118 mg/dL — ABNORMAL HIGH (ref 70–99)
Glucose-Capillary: 132 mg/dL — ABNORMAL HIGH (ref 70–99)
Glucose-Capillary: 157 mg/dL — ABNORMAL HIGH (ref 70–99)

## 2020-06-07 SURGERY — ESOPHAGOGASTRODUODENOSCOPY (EGD) WITH PROPOFOL
Anesthesia: General

## 2020-06-07 MED ORDER — LIDOCAINE HCL (CARDIAC) PF 100 MG/5ML IV SOSY
PREFILLED_SYRINGE | INTRAVENOUS | Status: DC | PRN
  Administered 2020-06-07: 100 mg via INTRAVENOUS

## 2020-06-07 MED ORDER — PANTOPRAZOLE SODIUM 40 MG PO TBEC: 1.0000 | DELAYED_RELEASE_TABLET | Freq: Every day | ORAL | 1 refills | Status: DC

## 2020-06-07 MED ORDER — PROPOFOL 10 MG/ML IV BOLUS
INTRAVENOUS | Status: DC | PRN
  Administered 2020-06-07: 20 mg via INTRAVENOUS
  Administered 2020-06-07: 60 mg via INTRAVENOUS
  Administered 2020-06-07 (×2): 20 mg via INTRAVENOUS

## 2020-06-07 MED ORDER — RANOLAZINE ER 1000 MG PO TB12: 1000.0000 mg | ORAL_TABLET | Freq: Two times a day (BID) | ORAL | 1 refills | Status: AC

## 2020-06-07 MED ORDER — PROPOFOL 500 MG/50ML IV EMUL
INTRAVENOUS | Status: DC | PRN
  Administered 2020-06-07: 150 ug/kg/min via INTRAVENOUS

## 2020-06-07 MED ORDER — ISOSORBIDE DINITRATE 10 MG PO TABS: 10.0000 mg | ORAL_TABLET | Freq: Three times a day (TID) | ORAL | 1 refills | Status: AC

## 2020-06-07 MED ORDER — ONDANSETRON HCL 4 MG/2ML IJ SOLN: 4.0000 mg | Freq: Once | INTRAMUSCULAR | Status: DC | PRN

## 2020-06-07 MED ORDER — BLOOD GLUCOSE MONITOR KIT: PACK | 0 refills | Status: AC

## 2020-06-07 MED ORDER — LISINOPRIL 10 MG PO TABS: 5.0000 mg | ORAL_TABLET | Freq: Every day | ORAL | Status: AC

## 2020-06-07 MED ORDER — GLIPIZIDE 5 MG PO TABS: 5.0000 mg | ORAL_TABLET | Freq: Every day | ORAL | 1 refills | Status: AC

## 2020-06-07 MED ORDER — SODIUM CHLORIDE 0.9 % IV SOLN: INTRAVENOUS | Status: DC | PRN

## 2020-06-07 MED ORDER — SODIUM CHLORIDE 0.9 % IV SOLN: INTRAVENOUS | Status: DC

## 2020-06-07 MED ORDER — HYDROCORTISONE (PERIANAL) 2.5 % EX CREA
TOPICAL_CREAM | Freq: Two times a day (BID) | CUTANEOUS | Status: DC
  Filled 2020-06-07: qty 28.35

## 2020-06-07 NOTE — Anesthesia Postprocedure Evaluation (Signed)
Anesthesia Post Note  Patient: [place]  Procedure(s) Performed: ESOPHAGOGASTRODUODENOSCOPY (EGD) WITH PROPOFOL (N/A )  Patient location during evaluation: PACU Anesthesia Type: General Level of consciousness: awake and alert Pain management: pain level controlled Vital Signs Assessment: post-procedure vital signs reviewed and stable Respiratory status: spontaneous breathing, nonlabored ventilation, respiratory function stable and patient connected to nasal cannula oxygen Cardiovascular status: blood pressure returned to baseline and stable Postop Assessment: no apparent nausea or vomiting Anesthetic complications: no   No complications documented.   Last Vitals:  Vitals:   06/07/20 0945 06/07/20 1154  BP: (!) 131/58 113/69  Pulse: 60 61  Resp: 15 17  Temp:  36.7 C  SpO2: 100% 97%    Last Pain:  Vitals:   06/07/20 1113  TempSrc:   PainSc: 0-No pain                 Corinda Gubler

## 2020-06-07 NOTE — Transfer of Care (Signed)
Immediate Anesthesia Transfer of Care Note  Patient: Richard Malone  Procedure(s) Performed: ESOPHAGOGASTRODUODENOSCOPY (EGD) WITH PROPOFOL (N/A )  Patient Location: PACU  Anesthesia Type:General  Level of Consciousness: awake, alert  and oriented  Airway & Oxygen Therapy: Patient Spontanous Breathing and Patient connected to face mask oxygen  Post-op Assessment: Report given to RN and Post -op Vital signs reviewed and stable  Post vital signs: Reviewed and stable  Last Vitals:  Vitals Value Taken Time  BP 121/53 06/07/20 0935  Temp    Pulse 60 06/07/20 0936  Resp 16 06/07/20 0936  SpO2 100 % 06/07/20 0936  Vitals shown include unvalidated device data.  Last Pain:  Vitals:   06/07/20 0832  TempSrc:   PainSc: 6       Patients Stated Pain Goal: 1 (06/05/20 0851)  Complications: No complications documented.

## 2020-06-07 NOTE — Progress Notes (Signed)
PATIENT STATES HE IS HERE FROM [place], TRAVELING THRU TO [place] AND WORK BUDDIES DROPPED HIM OFF HERE AT ER AND STOLE HIS WALLET WITH ALL OF HIS MONEY AND ID IN IT. DOCTOR WANTS TO DISCHARGE PATIENT AND HE HAS NO MONEY TO GET MEDS OR HOTEL ROOM OR RIDE. CASE MANAGER APPROVED A TAXI VOUCHER FOR HIM AND HIS SISTER SENT HIM MONEY FOR HOTEL ROOM TO [facility] WHICH HE IS GOING TO STOP BY ON TAXI TO PICK UP. ADMINISTERED ALL OF TODAY'S MEDS PRIOR TO DISCHARGING. PATIENT STATES THAT HE WILL HAVE MONEY TOMORROW TO GET HIS MEDS. HE WAS GIVEN HIS PRESCRIPTIONS IN HIS DISCHARGE PACKET.

## 2020-06-07 NOTE — Anesthesia Procedure Notes (Signed)
Date/Time: 06/07/2020 9:15 AM Performed by: Joanette Gula, Dynver Clemson, CRNA Pre-anesthesia Checklist: Patient identified, Emergency Drugs available, Suction available, Patient being monitored and Timeout performed Patient Re-evaluated:Patient Re-evaluated prior to induction Oxygen Delivery Method: Simple face mask Induction Type: IV induction

## 2020-06-07 NOTE — Discharge Summary (Addendum)
Physician Discharge Summary  Richard Malone SVX:[id number] DOB: [birth year] DOA: 06/03/2020  PCP: System, Provider Not In  Admit date: 06/03/2020 Discharge date: 06/07/2020  Admitted From: home Disposition:  home  Recommendations for Outpatient Follow-up:  1. Follow up with PCP in 1-2 weeks 2. Please obtain BMP/CBC in one week 3. Follow up with cardiology as an outpatient 4. Follow up with GI as an outpatient  Discharge Condition: stable CODE STATUS:full code Diet recommendation: heart healthy, carb modified  Brief/Interim Summary: 55 year old male with a history of coronary artery disease status post CABG, admitted to the hospital with chest pain.  There were initial concerns for unstable angina.  He underwent cardiac cath on 5/26 that did not show any interventional targets.  He did have diffuse disease.  Recommendations were for medical management.  He was started on isosorbide dinitrate 3 times daily with improvement of his symptoms.  Subsequently, patient did complain of some blood in his stools.  He was seen by gastroenterology and underwent EGD.  No signs of active bleeding were noted and his hemoglobin remained stable throughout his hospital course.  Recommendations were to continue on Protonix once daily.  He was noted to have a microcytic anemia.  This will need further work-up as an outpatient.  Hemoglobin has been stable.  Patient complained of pain in his chest and subsequently his abdomen.  He did frequently request for pain medications.  Initially receiving IV morphine, subsequently was transitioned to Percocet.  The patient reports that he is not from the area, and is originally from [place].  He is only traveling through the area.  He was seen by social work to help with resources including a ride from the hospital.  He reported that he did not have anywhere to stay after leaving the hospital and he was given information regarding resources/shelters.  Discharge Diagnoses:  Active  Problems:   Chest pain   CAD (coronary artery disease)   CHF (congestive heart failure) ([place])   Unstable angina [facility])    Discharge Instructions  Discharge Instructions    Diet - low sodium heart healthy   Complete by: As directed    Increase activity slowly   Complete by: As directed      Allergies as of 06/07/2020      Reactions   Drug Ingredient [atorvastatin]       Medication List    TAKE these medications   aspirin 325 MG tablet Take 325 mg by mouth daily.   blood glucose meter kit and supplies Kit Dispense based on patient and insurance preference. Use up to four times daily as directed.   clopidogrel 75 MG tablet Commonly known as: PLAVIX Take 1 tablet by mouth daily.   glipiZIDE 5 MG tablet Commonly known as: GLUCOTROL Take 1 tablet (5 mg total) by mouth daily before breakfast. Start taking on: Jun 08, 2020   isosorbide dinitrate 10 MG tablet Commonly known as: ISORDIL Take 1 tablet (10 mg total) by mouth 3 (three) times daily.   lisinopril 10 MG tablet Commonly known as: ZESTRIL Take 0.5 tablets (5 mg total) by mouth daily. What changed: how much to take   metoprolol succinate 25 MG 24 hr tablet Commonly known as: TOPROL-XL Take 1 tablet by mouth in the morning and at bedtime.   pantoprazole 40 MG tablet Commonly known as: PROTONIX Take 1 tablet (40 mg total) by mouth daily.   ranolazine 1000 MG SR tablet Commonly known as: RANEXA Take 1 tablet (1,000 mg total) by  mouth 2 (two) times daily. What changed:   medication strength  how much to take       Allergies  Allergen Reactions  . Drug Ingredient [Atorvastatin]     Consultations:  Cardiology  Gastroenterology   Procedures/Studies: DG Chest 2 View  Result Date: 06/02/2020 CLINICAL DATA:  55 year old male with chest pain. EXAM: CHEST - 2 VIEW COMPARISON:  None. FINDINGS: No focal consolidation, pleural effusion, or pneumothorax. Mild cardiomegaly. Median sternotomy wires and  coronary stent. No acute osseous pathology. Several metallic pellets noted over the right shoulder. IMPRESSION: No acute cardiopulmonary process. Electronically Signed   By: Anner Crete M.D.   On: 06/02/2020 22:58   CARDIAC CATHETERIZATION  Result Date: 06/04/2020 Conclusions: 1. Severe left coronary artery disease, including chronic total occlusions of mid LAD, D1, D2, OM2, and distal LCx. 2. Mild to moderate, non-obstructive disease involving dominant RCA. 3. Patent LIMA-LAD with 40% in-stent restenosis of distal LAD stent and 80% stenosis involving the apical LAD. 4. Patent SVG-D1 with occlusion of D1 just beyond the anastomosis. 5. Patent SVG-OM2-OM3 with mild in-stent restenosis of mid-graft stent.  OM2 is occluded proximal and distal to graft anastomosis. 6. Mildly elevated left ventricular filling pressure (LVEDP 20-25 mmHg). 7. Chronically occluded left radial artery. 8. Significant scar tissue in the right groin making femoral access challenging.  Consider left femoral access for future arterial access. Recommendations: 1. No interventional targets; escalate antianginal therapy. 2. Gentle diuresis. 3. Obtain echocardiogram. 4. Aggressive secondary prevention. Nelva Bush, MD [facility]   PERIPHERAL VASCULAR CATHETERIZATION  Result Date: 06/04/2020 Conclusions: 1. Severe left coronary artery disease, including chronic total occlusions of mid LAD, D1, D2, OM2, and distal LCx. 2. Mild to moderate, non-obstructive disease involving dominant RCA. 3. Patent LIMA-LAD with 40% in-stent restenosis of distal LAD stent and 80% stenosis involving the apical LAD. 4. Patent SVG-D1 with occlusion of D1 just beyond the anastomosis. 5. Patent SVG-OM2-OM3 with mild in-stent restenosis of mid-graft stent.  OM2 is occluded proximal and distal to graft anastomosis. 6. Mildly elevated left ventricular filling pressure (LVEDP 20-25 mmHg). 7. Chronically occluded left radial artery. 8. Significant scar tissue  in the right groin making femoral access challenging.  Consider left femoral access for future arterial access. Recommendations: 1. No interventional targets; escalate antianginal therapy. 2. Gentle diuresis. 3. Obtain echocardiogram. 4. Aggressive secondary prevention. Nelva Bush, MD [facility]   ECHOCARDIOGRAM COMPLETE  Result Date: 06/04/2020    ECHOCARDIOGRAM REPORT   Patient Name:   Richard Malone Date of Exam: 06/04/2020 Medical Rec #:  [id number]     Height:       70.0 in Accession #:    [id number]    Weight:       270.0 lb Date of Birth:  [birth year]     BSA:          2.371 m Patient Age:    21 years      BP:           120/79 mmHg Patient Gender: M             HR:           68 bpm. Exam Location:  [facility] Procedure: 2D Echo, Cardiac Doppler and Color Doppler Indications:     Chest pain R07.9  History:         Patient has no prior history of Echocardiogram examinations.                  Previous  Myocardial Infarction; Risk Factors:Hypertension.  Sonographer:     Sherrie Sport RDCS (AE) Referring Phys:  [id number] CHRISTOPHER END Diagnosing Phys: Ida Rogue MD  Sonographer Comments: Technically challenging study due to limited acoustic windows, no apical window and no subcostal window. IMPRESSIONS  1. Left ventricular ejection fraction, by estimation, is 60 to 65%. The left ventricle has normal function. The left ventricle has no regional wall motion abnormalities. Left ventricular diastolic parameters are indeterminate.  2. Right ventricular systolic function is normal. The right ventricular size is normal. Tricuspid regurgitation signal is inadequate for assessing PA pressure.  3. Left atrial size was moderately dilated. FINDINGS  Left Ventricle: Left ventricular ejection fraction, by estimation, is 60 to 65%. The left ventricle has normal function. The left ventricle has no regional wall motion abnormalities. The left ventricular internal cavity size was normal in size. There is  no left ventricular  hypertrophy. Left ventricular diastolic parameters are indeterminate. Right Ventricle: The right ventricular size is normal. No increase in right ventricular wall thickness. Right ventricular systolic function is normal. Tricuspid regurgitation signal is inadequate for assessing PA pressure. Left Atrium: Left atrial size was moderately dilated. Right Atrium: Right atrial size was normal in size. Pericardium: There is no evidence of pericardial effusion. Mitral Valve: The mitral valve was not well visualized. No evidence of mitral valve regurgitation. No evidence of mitral valve stenosis. Tricuspid Valve: The tricuspid valve is not well visualized. Tricuspid valve regurgitation is not demonstrated. No evidence of tricuspid stenosis. Aortic Valve: The aortic valve was not well visualized. Aortic valve regurgitation is not visualized. No aortic stenosis is present. Pulmonic Valve: The pulmonic valve was not well visualized. Pulmonic valve regurgitation is not visualized. No evidence of pulmonic stenosis. Aorta: The aortic root is normal in size and structure. Venous: The inferior vena cava is normal in size with greater than 50% respiratory variability, suggesting right atrial pressure of 3 mmHg. IAS/Shunts: No atrial level shunt detected by color flow Doppler.  LEFT VENTRICLE PLAX 2D LVIDd:         5.48 cm LVIDs:         3.89 cm LV PW:         1.32 cm LV IVS:        0.99 cm LVOT diam:     2.20 cm LVOT Area:     3.80 cm  LEFT ATRIUM         Index LA diam:    6.00 cm 2.53 cm/m                        PULMONIC VALVE AORTA                 PV Vmax:        0.87 m/s Ao Root diam: 3.27 cm PV Peak grad:   3.0 mmHg                       RVOT Peak grad: 2 mmHg   SHUNTS Systemic Diam: 2.20 cm Ida Rogue MD Electronically signed by Ida Rogue MD Signature Date/Time: 06/04/2020/7:07:49 PM    Final       Subjective: No chest pain, still has some nausea  Discharge Exam: Vitals:   06/07/20 0857 06/07/20 0936 06/07/20  0945 06/07/20 1154  BP: 101/69 (!) 121/53 (!) 131/58 113/69  Pulse: 65 60 60 61  Resp: $Remo'17  15 17  'oHXHJ$ Temp: 97.9 F (36.6 C) (!) 97.2 F (  36.2 C)  98.1 F (36.7 C)  TempSrc:      SpO2: 97% 100% 100% 97%  Weight:      Height:        General: Pt is alert, awake, not in acute distress Cardiovascular: RRR, S1/S2 +, no rubs, no gallops Respiratory: CTA bilaterally, no wheezing, no rhonchi Abdominal: Soft, NT, ND, bowel sounds + Extremities: no edema, no cyanosis    The results of significant diagnostics from this hospitalization (including imaging, microbiology, ancillary and laboratory) are listed below for reference.     Microbiology: Recent Results (from the past 240 hour(s))  SARS CORONAVIRUS 2 (TAT 6-24 HRS) Nasopharyngeal Nasopharyngeal Swab     Status: None   Collection Time: 06/03/20  5:37 AM   Specimen: Nasopharyngeal Swab  Result Value Ref Range Status   SARS Coronavirus 2 NEGATIVE NEGATIVE Final    Comment: (NOTE) SARS-CoV-2 target nucleic acids are NOT DETECTED.  The SARS-CoV-2 RNA is generally detectable in upper and lower respiratory specimens during the acute phase of infection. Negative results do not preclude SARS-CoV-2 infection, do not rule out co-infections with other pathogens, and should not be used as the sole basis for treatment or other patient management decisions. Negative results must be combined with clinical observations, patient history, and epidemiological information. The expected result is Negative.  Fact Sheet for Patients: SugarRoll.be  Fact Sheet for Healthcare Providers: https://www.woods-mathews.com/  This test is not yet approved or cleared by the [place] FDA and  has been authorized for detection and/or diagnosis of SARS-CoV-2 by FDA under an Emergency Use Authorization (EUA). This EUA will remain  in effect (meaning this test can be used) for the duration of the COVID-19 declaration  under Se ction 564(b)(1) of the Act, 21 U.S.C. section 360bbb-3(b)(1), unless the authorization is terminated or revoked sooner.  Performed at [facility], [place] [address], [place], [place] [address]      Labs: BNP (last 3 results) No results for input(s): BNP in the last 8760 hours. Basic Metabolic Panel: Recent Labs  Lab 06/02/20 2242 06/04/20 0056  NA 136 134*  K 3.7 3.7  CL 103 102  CO2 22 24  GLUCOSE 214* 306*  BUN 12 13  CREATININE 0.90 0.94  CALCIUM 8.6* 7.6*   Liver Function Tests: Recent Labs  Lab 06/02/20 2242  AST 17  ALT 13  ALKPHOS 73  BILITOT 0.4  PROT 7.1  ALBUMIN 3.5   No results for input(s): LIPASE, AMYLASE in the last 168 hours. No results for input(s): AMMONIA in the last 168 hours. CBC: Recent Labs  Lab 06/02/20 2242 06/04/20 0056 06/05/20 1201 06/06/20 0425 06/07/20 0705  WBC 8.4 7.2  --   --  8.5  NEUTROABS 5.1  --   --   --   --   HGB 10.8* 9.2* 10.5* 9.5* 10.0*  HCT 36.2* 30.7* 34.6* 30.9* 33.5*  MCV 79.0* 78.1*  --   --  77.5*  PLT 338 297  --   --  312   Cardiac Enzymes: No results for input(s): CKTOTAL, CKMB, CKMBINDEX, TROPONINI in the last 168 hours. BNP: Invalid input(s): POCBNP CBG: Recent Labs  Lab 06/06/20 1210 06/06/20 1624 06/06/20 2051 06/07/20 0859 06/07/20 1155  GLUCAP 140* 134* 137* 118* 132*   D-Dimer No results for input(s): DDIMER in the last 72 hours. Hgb A1c No results for input(s): HGBA1C in the last 72 hours. Lipid Profile No results for input(s): CHOL, HDL, LDLCALC, TRIG, CHOLHDL, LDLDIRECT in  the last 72 hours. Thyroid function studies No results for input(s): TSH, T4TOTAL, T3FREE, THYROIDAB in the last 72 hours.  Invalid input(s): FREET3 Anemia work up No results for input(s): VITAMINB12, FOLATE, FERRITIN, TIBC, IRON, RETICCTPCT in the last 72 hours. Urinalysis No results found for: COLORURINE, APPEARANCEUR, [place], [place], [place], [place], [place], [place], PROTEINUR,  UROBILINOGEN, NITRITE, LEUKOCYTESUR Sepsis Labs Invalid input(s): PROCALCITONIN,  WBC,  LACTICIDVEN Microbiology Recent Results (from the past 240 hour(s))  SARS CORONAVIRUS 2 (TAT 6-24 HRS) Nasopharyngeal Nasopharyngeal Swab     Status: None   Collection Time: 06/03/20  5:37 AM   Specimen: Nasopharyngeal Swab  Result Value Ref Range Status   SARS Coronavirus 2 NEGATIVE NEGATIVE Final    Comment: (NOTE) SARS-CoV-2 target nucleic acids are NOT DETECTED.  The SARS-CoV-2 RNA is generally detectable in upper and lower respiratory specimens during the acute phase of infection. Negative results do not preclude SARS-CoV-2 infection, do not rule out co-infections with other pathogens, and should not be used as the sole basis for treatment or other patient management decisions. Negative results must be combined with clinical observations, patient history, and epidemiological information. The expected result is Negative.  Fact Sheet for Patients: SugarRoll.be  Fact Sheet for Healthcare Providers: https://www.woods-mathews.com/  This test is not yet approved or cleared by the [place] FDA and  has been authorized for detection and/or diagnosis of SARS-CoV-2 by FDA under an Emergency Use Authorization (EUA). This EUA will remain  in effect (meaning this test can be used) for the duration of the COVID-19 declaration under Se ction 564(b)(1) of the Act, 21 U.S.C. section 360bbb-3(b)(1), unless the authorization is terminated or revoked sooner.  Performed at [facility], [place] [address] [place], [place] [address]      Time coordinating discharge: 3mins  SIGNED:   Kathie Dike, MD  [facility] 06/07/2020, 1:45 PM   If 7PM-7AM, please contact night-coverage [email or web address]

## 2020-06-07 NOTE — Op Note (Signed)
[facility] Gastroenterology Patient Name: Richard Malone Procedure Date: 06/07/2020 8:58 AM MRN: [id number] Account #: [id number] Date of Birth: 01-09-1966 Admit Type: Inpatient Age: 55 Room: [facility] ENDO ROOM 2 Gender: Male Note Status: Finalized Procedure:             Upper GI endoscopy Indications:           Periumbilical abdominal pain, Melena Providers:             Boykin Nearing. Aren Cherne MD, MD Medicines:             Propofol per Anesthesia Complications:         No immediate complications. Procedure:             Pre-Anesthesia Assessment:                        - The risks and benefits of the procedure and the                         sedation options and risks were discussed with the                         patient. All questions were answered and informed                         consent was obtained.                        - Patient identification and proposed procedure were                         verified prior to the procedure by the nurse. The                         procedure was verified in the procedure room.                        - ASA Grade Assessment: III - A patient with severe                         systemic disease.                        - After reviewing the risks and benefits, the patient                         was deemed in satisfactory condition to undergo the                         procedure.                        After obtaining informed consent, the endoscope was                         passed under direct vision. Throughout the procedure,                         the patient's blood pressure, pulse, and oxygen  saturations were monitored continuously. The Endoscope                         was introduced through the mouth, and advanced to the                         second part of duodenum. The upper GI endoscopy was                         accomplished without difficulty. The patient tolerated                          the procedure well. Findings:      There were esophageal mucosal changes suspicious for short-segment       Barrett's esophagus present in the distal esophagus. The maximum       longitudinal extent of these mucosal changes was 3 cm in length. Mucosa       was biopsied with a cold forceps for histology randomly at the       gastroesophageal junction. One specimen bottle was sent to pathology.       Estimated blood loss was minimal.      Patchy mild inflammation characterized by congestion (edema) and       erythema was found in the entire examined stomach.      There is no endoscopic evidence of bleeding, ulceration or mucosal       friability in the entire examined stomach.      A 3 cm hiatal hernia was present.      The examined duodenum was normal.      The exam was otherwise without abnormality. Impression:            - Esophageal mucosal changes suspicious for                         short-segment Barrett's esophagus. Biopsied.                        - Gastritis.                        - 3 cm hiatal hernia.                        - Normal examined duodenum.                        - The examination was otherwise normal. Recommendation:        - Return patient to hospital ward for possible                         discharge same day.                        - Advance diet as tolerated.                        - Use Protonix (pantoprazole) 40 mg PO daily.                        - Await pathology results.                        -  Return to my office in 4 weeks.                        - The findings and recommendations were discussed with                         the patient. Procedure Code(s):     --- Professional ---                        [phone], Esophagogastroduodenoscopy, flexible,                         transoral; with biopsy, single or multiple Diagnosis Code(s):     --- Professional ---                        K92.1, Melena (includes Hematochezia)                        R10.33,  Periumbilical pain                        K44.9, Diaphragmatic hernia without obstruction or                         gangrene                        K29.70, Gastritis, unspecified, without bleeding                        K22.8, Other specified diseases of esophagus CPT copyright 2019 American Medical Association. All rights reserved. The codes documented in this report are preliminary and upon coder review may  be revised to meet current compliance requirements. Stanton Kidney MD, MD 06/07/2020 9:37:34 AM This report has been signed electronically. Number of Addenda: 0 Note Initiated On: 06/07/2020 8:58 AM Estimated Blood Loss:  Estimated blood loss was minimal.      [facility]

## 2020-06-07 NOTE — TOC Progression Note (Addendum)
Transition of Care [facility]) - Progression Note    Patient Details  Name: Richard Malone MRN: [id number] Date of Birth: 05-15-1965  Transition of [facility]) CM/SW Contact  Hetty Ely, RN Phone Number: 06/07/2020, 3:00 PM  Clinical Narrative: Attempted to call patient's sister Richard Malone to discuss patient discharge plan, no answer left voice message to return call. Patient in shower informed the nurse that he has no where to go and no wallet to get medications. To discuss unsafe discharge with Attending and RN, sister did not return call.  Spoke with patient who says he was with friends he knew of ten years, who dropped him off here due to evaluation of chest pains. He did call the friends and they told him they will not come back to get him and found $600 in his wallet. Patient states his sister called him and said I called and she will see if she could wire money at [employer] so he can get a room, sister does not have a credit card. Patient will let us know when he hear from sister so he can be discharged. I did attempt twice to reach on-call supervisor no answer.         Expected Discharge Plan and Services           Expected Discharge Date: 06/07/20                                     Social Determinants of Health (SDOH) Interventions    Readmission Risk Interventions No flowsheet data found.

## 2020-06-07 NOTE — Anesthesia Preprocedure Evaluation (Addendum)
Anesthesia Evaluation  Patient identified by MRN, date of birth, ID band Patient awake  General Assessment Comment: Presented to hospital with concern for unstable angina (negative troponins), see subsequent cath and echo info.  Also with some hx of inability to keep food down, potentially GIB symptoms as well. Patient denies vomiting or nausea today. Last vomit was yesterday    Reviewed: Allergy & Precautions, NPO status , Patient's Chart, lab work & pertinent test results  History of Anesthesia Complications Negative for: history of anesthetic complications  Airway Mallampati: III  TM Distance: >3 FB Neck ROM: Full    Dental  (+) Chipped   Pulmonary neg pulmonary ROS, neg sleep apnea, neg COPD, Patient abstained from smoking.Not current smoker, former smoker,    Pulmonary exam normal breath sounds clear to auscultation       Cardiovascular Exercise Tolerance: Good METShypertension, + CAD, + Past MI, + Cardiac Stents, + CABG and +CHF  (-) dysrhythmias  Rhythm:Regular Rate:Normal - Systolic murmurs TTE 2022 with normval LV function. LHC 2022 with no intervenable targets: Conclusions: 1. Severe left coronary artery disease, including chronic total occlusions of mid LAD, D1, D2, OM2, and distal LCx. 2. Mild to moderate, non-obstructive disease involving dominant RCA. 3. Patent LIMA-LAD with 40% in-stent restenosis of distal LAD stent and 80% stenosis involving the apical LAD. 4. Patent SVG-D1 with occlusion of D1 just beyond the anastomosis. 5. Patent SVG-OM2-OM3 with mild in-stent restenosis of mid-graft stent.  OM2 is occluded proximal and distal to graft anastomosis. 6. Mildly elevated left ventricular filling pressure (LVEDP 20-25 mmHg). 7. Chronically occluded left radial artery. 8. Significant scar tissue in the right groin making femoral access challenging.  Consider left femoral access for future arterial access.      Neuro/Psych negative neurological ROS  negative psych ROS   GI/Hepatic neg GERD  ,(+)     (-) substance abuse  ,   Endo/Other  diabetes New diabetes diagnosis this admission  Renal/GU negative Renal ROS     Musculoskeletal   Abdominal (+) + obese,   Peds  Hematology   Anesthesia Other Findings Past Medical History: No date: CAD (coronary artery disease)     Comment:  a. s/p first MI @ age 25; b. 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 reports h/o 16 stents; d. last cath ~ 4 yrs ago in               [place]. No date: HTN (hypertension) No date: Hyperlipidemia LDL goal <70     Comment:  a. Statin/PCSK9i intolerant. Does not want to try               alternate agents. No date: MI (myocardial infarction) (HCC) No date: Obesity  Reproductive/Obstetrics                            Anesthesia Physical Anesthesia Plan  ASA: III  Anesthesia Plan: General   Post-op Pain Management:    Induction: Intravenous  PONV Risk Score and Plan: 2 and Ondansetron, Propofol infusion and TIVA  Airway Management Planned: Nasal Cannula  Additional Equipment: None  Intra-op Plan:   Post-operative Plan:   Informed Consent: I have reviewed the patients History and Physical, chart, labs and discussed the procedure including the risks, benefits and alternatives for the proposed anesthesia with the patient or authorized representative who has indicated his/her understanding and  acceptance.     Dental advisory given  Plan Discussed with: CRNA and Surgeon  Anesthesia Plan Comments: (Discussed risks of anesthesia with patient, including possibility of difficulty with spontaneous ventilation under anesthesia necessitating airway intervention, PONV, and rare risks such as cardiac or respiratory or neurological events. Patient understands.)        Anesthesia Quick Evaluation

## 2020-06-07 NOTE — Interval H&P Note (Signed)
History and Physical Interval Note:  06/07/2020 9:23 AM  Richard Malone  has presented today for surgery, with the diagnosis of Melena, nausea, vomiting, abdominal pain.  The various methods of treatment have been discussed with the patient and family. After consideration of risks, benefits and other options for treatment, the patient has consented to  Procedure(s): ESOPHAGOGASTRODUODENOSCOPY (EGD) WITH PROPOFOL (N/A) as a surgical intervention.  The patient's history has been reviewed, patient examined, no change in status, stable for surgery.  I have reviewed the patient's chart and labs.  Questions were answered to the patient's satisfaction.     [place], [place]

## 2020-06-08 ENCOUNTER — Encounter: Payer: Self-pay | Admitting: Internal Medicine

## 2020-06-09 ENCOUNTER — Other Ambulatory Visit: Payer: Self-pay

## 2020-06-09 ENCOUNTER — Emergency Department: Payer: Self-pay

## 2020-06-09 ENCOUNTER — Encounter: Payer: Self-pay | Admitting: Emergency Medicine

## 2020-06-09 ENCOUNTER — Observation Stay: Payer: Self-pay

## 2020-06-09 ENCOUNTER — Inpatient Hospital Stay
Admission: EM | Admit: 2020-06-09 | Discharge: 2020-06-13 | DRG: 394 | Disposition: A | Discharge status: Home / Self Care | Payer: Self-pay | Attending: Internal Medicine | Admitting: Internal Medicine

## 2020-06-09 DIAGNOSIS — Z8711 Personal history of peptic ulcer disease: Secondary | ICD-10-CM

## 2020-06-09 DIAGNOSIS — Z20822 Contact with and (suspected) exposure to covid-19: Secondary | ICD-10-CM | POA: Diagnosis present

## 2020-06-09 DIAGNOSIS — Z7982 Long term (current) use of aspirin: Secondary | ICD-10-CM

## 2020-06-09 DIAGNOSIS — K219 Gastro-esophageal reflux disease without esophagitis: Secondary | ICD-10-CM | POA: Diagnosis present

## 2020-06-09 DIAGNOSIS — Z800 Family history of malignant neoplasm of digestive organs: Secondary | ICD-10-CM

## 2020-06-09 DIAGNOSIS — K920 Hematemesis: Secondary | ICD-10-CM

## 2020-06-09 DIAGNOSIS — Z6838 Body mass index (BMI) 38.0-38.9, adult: Secondary | ICD-10-CM

## 2020-06-09 DIAGNOSIS — Z951 Presence of aortocoronary bypass graft: Secondary | ICD-10-CM

## 2020-06-09 DIAGNOSIS — E785 Hyperlipidemia, unspecified: Secondary | ICD-10-CM | POA: Diagnosis present

## 2020-06-09 DIAGNOSIS — I252 Old myocardial infarction: Secondary | ICD-10-CM

## 2020-06-09 DIAGNOSIS — K319 Disease of stomach and duodenum, unspecified: Secondary | ICD-10-CM | POA: Diagnosis present

## 2020-06-09 DIAGNOSIS — I5032 Chronic diastolic (congestive) heart failure: Secondary | ICD-10-CM | POA: Diagnosis present

## 2020-06-09 DIAGNOSIS — Z79899 Other long term (current) drug therapy: Secondary | ICD-10-CM

## 2020-06-09 DIAGNOSIS — Z7984 Long term (current) use of oral hypoglycemic drugs: Secondary | ICD-10-CM

## 2020-06-09 DIAGNOSIS — K5730 Diverticulosis of large intestine without perforation or abscess without bleeding: Secondary | ICD-10-CM | POA: Diagnosis present

## 2020-06-09 DIAGNOSIS — K922 Gastrointestinal hemorrhage, unspecified: Secondary | ICD-10-CM

## 2020-06-09 DIAGNOSIS — Z7902 Long term (current) use of antithrombotics/antiplatelets: Secondary | ICD-10-CM

## 2020-06-09 DIAGNOSIS — Z87891 Personal history of nicotine dependence: Secondary | ICD-10-CM

## 2020-06-09 DIAGNOSIS — Z8249 Family history of ischemic heart disease and other diseases of the circulatory system: Secondary | ICD-10-CM

## 2020-06-09 DIAGNOSIS — I110 Hypertensive heart disease with heart failure: Secondary | ICD-10-CM | POA: Diagnosis present

## 2020-06-09 DIAGNOSIS — K449 Diaphragmatic hernia without obstruction or gangrene: Secondary | ICD-10-CM | POA: Diagnosis present

## 2020-06-09 DIAGNOSIS — K2270 Barrett's esophagus without dysplasia: Secondary | ICD-10-CM | POA: Diagnosis present

## 2020-06-09 DIAGNOSIS — I2510 Atherosclerotic heart disease of native coronary artery without angina pectoris: Secondary | ICD-10-CM | POA: Diagnosis present

## 2020-06-09 DIAGNOSIS — K644 Residual hemorrhoidal skin tags: Principal | ICD-10-CM | POA: Diagnosis present

## 2020-06-09 DIAGNOSIS — E119 Type 2 diabetes mellitus without complications: Secondary | ICD-10-CM

## 2020-06-09 DIAGNOSIS — E669 Obesity, unspecified: Secondary | ICD-10-CM | POA: Diagnosis present

## 2020-06-09 DIAGNOSIS — D509 Iron deficiency anemia, unspecified: Secondary | ICD-10-CM | POA: Diagnosis present

## 2020-06-09 DIAGNOSIS — I10 Essential (primary) hypertension: Secondary | ICD-10-CM

## 2020-06-09 LAB — COMPREHENSIVE METABOLIC PANEL
ALT: 13 U/L (ref 0–44)
AST: 22 U/L (ref 15–41)
Albumin: 4.1 g/dL (ref 3.5–5.0)
Alkaline Phosphatase: 77 U/L (ref 38–126)
Anion gap: 11 (ref 5–15)
BUN: 8 mg/dL (ref 6–20)
CO2: 24 mmol/L (ref 22–32)
Calcium: 8.9 mg/dL (ref 8.9–10.3)
Chloride: 101 mmol/L (ref 98–111)
Creatinine, Ser: 1.09 mg/dL (ref 0.61–1.24)
GFR, Estimated: >60 mL/min (ref >60)
Glucose, Bld: 172 mg/dL — ABNORMAL HIGH (ref 70–99)
Potassium: 4.1 mmol/L (ref 3.5–5.1)
Sodium: 136 mmol/L (ref 135–145)
Total Bilirubin: 0.7 mg/dL (ref 0.3–1.2)
Total Protein: 8.4 g/dL — ABNORMAL HIGH (ref 6.5–8.1)

## 2020-06-09 LAB — CBC WITH DIFFERENTIAL/PLATELET
Abs Immature Granulocytes: 0.06 10*3/uL (ref 0.00–0.07)
Basophils Absolute: 0 10*3/uL (ref 0.0–0.1)
Basophils Relative: 0 %
Eosinophils Absolute: 0.1 10*3/uL (ref 0.0–0.5)
Eosinophils Relative: 1 %
HCT: 39.1 % (ref 39.0–52.0)
Hemoglobin: 11.8 g/dL — ABNORMAL LOW (ref 13.0–17.0)
Immature Granulocytes: 1 %
Lymphocytes Relative: 15 %
Lymphs Abs: 1.8 10*3/uL (ref 0.7–4.0)
MCH: 23.7 pg — ABNORMAL LOW (ref 26.0–34.0)
MCHC: 30.2 g/dL (ref 30.0–36.0)
MCV: 78.7 fL — ABNORMAL LOW (ref 80.0–100.0)
Monocytes Absolute: 1 10*3/uL (ref 0.1–1.0)
Monocytes Relative: 8 %
Neutro Abs: 8.9 10*3/uL — ABNORMAL HIGH (ref 1.7–7.7)
Neutrophils Relative %: 75 %
Platelets: 366 10*3/uL (ref 150–400)
RBC: 4.97 MIL/uL (ref 4.22–5.81)
RDW: 18.4 % — ABNORMAL HIGH (ref 11.5–15.5)
WBC: 12.1 10*3/uL — ABNORMAL HIGH (ref 4.0–10.5)
nRBC: 0 % (ref 0.0–0.2)

## 2020-06-09 LAB — TROPONIN I (HIGH SENSITIVITY)
Troponin I (High Sensitivity): 28 ng/L — ABNORMAL HIGH (ref <18)
Troponin I (High Sensitivity): 29 ng/L — ABNORMAL HIGH (ref <18)

## 2020-06-09 LAB — TYPE AND SCREEN
ABO/RH(D): O POS
Antibody Screen: NEGATIVE

## 2020-06-09 LAB — HEMOGLOBIN AND HEMATOCRIT, BLOOD
HCT: 34.2 % — ABNORMAL LOW (ref 39.0–52.0)
HCT: 33.7 % — ABNORMAL LOW (ref 39.0–52.0)
Hemoglobin: 10.5 g/dL — ABNORMAL LOW (ref 13.0–17.0)
Hemoglobin: 10.2 g/dL — ABNORMAL LOW (ref 13.0–17.0)

## 2020-06-09 LAB — SURGICAL PATHOLOGY

## 2020-06-09 LAB — RESP PANEL BY RT-PCR (FLU A&B, COVID) ARPGX2
Influenza A by PCR: NEGATIVE
Influenza B by PCR: NEGATIVE
SARS Coronavirus 2 by RT PCR: NEGATIVE

## 2020-06-09 LAB — CBG MONITORING, ED: Glucose-Capillary: 93 mg/dL (ref 70–99)

## 2020-06-09 LAB — LIPASE, BLOOD: Lipase: 29 U/L (ref 11–51)

## 2020-06-09 MED ORDER — METOPROLOL SUCCINATE ER 25 MG PO TB24
25.0000 mg | ORAL_TABLET | Freq: Every day | ORAL | Status: DC
  Administered 2020-06-09 – 2020-06-13 (×5): 25 mg via ORAL
  Filled 2020-06-09 (×4): qty 1

## 2020-06-09 MED ORDER — RANOLAZINE ER 500 MG PO TB12
1000.0000 mg | ORAL_TABLET | Freq: Two times a day (BID) | ORAL | Status: DC
  Administered 2020-06-09 – 2020-06-12 (×7): 1000 mg via ORAL
  Filled 2020-06-09 (×8): qty 2

## 2020-06-09 MED ORDER — ONDANSETRON HCL 4 MG/2ML IJ SOLN
4.0000 mg | Freq: Once | INTRAMUSCULAR | Status: AC
  Administered 2020-06-09: 4 mg via INTRAVENOUS
  Filled 2020-06-09: qty 2

## 2020-06-09 MED ORDER — SODIUM CHLORIDE 0.9 % IV SOLN
80.0000 mg | Freq: Once | INTRAVENOUS | Status: AC
  Administered 2020-06-09: 80 mg via INTRAVENOUS
  Filled 2020-06-09: qty 80

## 2020-06-09 MED ORDER — MORPHINE SULFATE (PF) 2 MG/ML IV SOLN
2.0000 mg | INTRAVENOUS | Status: DC | PRN
  Administered 2020-06-09 – 2020-06-12 (×14): 2 mg via INTRAVENOUS
  Filled 2020-06-09 (×14): qty 1

## 2020-06-09 MED ORDER — SODIUM CHLORIDE 0.9 % IV BOLUS
1000.0000 mL | Freq: Once | INTRAVENOUS | Status: AC
  Administered 2020-06-09: 1000 mL via INTRAVENOUS

## 2020-06-09 MED ORDER — ONDANSETRON HCL 4 MG PO TABS: 4.0000 mg | ORAL_TABLET | Freq: Four times a day (QID) | ORAL | Status: DC | PRN

## 2020-06-09 MED ORDER — TRAMADOL HCL 50 MG PO TABS: 50.0000 mg | ORAL_TABLET | Freq: Once | ORAL | Status: DC

## 2020-06-09 MED ORDER — MORPHINE SULFATE (PF) 4 MG/ML IV SOLN
4.0000 mg | Freq: Once | INTRAVENOUS | Status: AC
  Administered 2020-06-09: 4 mg via INTRAVENOUS
  Filled 2020-06-09: qty 1

## 2020-06-09 MED ORDER — SODIUM CHLORIDE 0.9 % IV SOLN: INTRAVENOUS | Status: DC

## 2020-06-09 MED ORDER — ONDANSETRON HCL 4 MG/2ML IJ SOLN
4.0000 mg | Freq: Four times a day (QID) | INTRAMUSCULAR | Status: DC | PRN
  Administered 2020-06-09 – 2020-06-11 (×4): 4 mg via INTRAVENOUS
  Filled 2020-06-09 (×4): qty 2

## 2020-06-09 MED ORDER — SODIUM CHLORIDE 0.9 % IV SOLN
8.0000 mg/h | INTRAVENOUS | Status: DC
  Administered 2020-06-09 – 2020-06-10 (×4): 8 mg/h via INTRAVENOUS
  Filled 2020-06-09 (×5): qty 80

## 2020-06-09 MED ORDER — IOHEXOL 350 MG/ML SOLN
100.0000 mL | Freq: Once | INTRAVENOUS | Status: AC | PRN
  Administered 2020-06-09: 20:00:00 100 mL via INTRAVENOUS

## 2020-06-09 MED ORDER — SODIUM CHLORIDE 0.9 % IV SOLN: 250.0000 mL | INTRAVENOUS | Status: DC | PRN

## 2020-06-09 MED ORDER — LISINOPRIL 5 MG PO TABS
5.0000 mg | ORAL_TABLET | Freq: Every day | ORAL | Status: DC
  Administered 2020-06-09: 19:00:00 5 mg via ORAL

## 2020-06-09 MED ORDER — SODIUM CHLORIDE 0.9% FLUSH: 3.0000 mL | INTRAVENOUS | Status: DC | PRN

## 2020-06-09 MED ORDER — ISOSORBIDE DINITRATE 10 MG PO TABS
10.0000 mg | ORAL_TABLET | Freq: Three times a day (TID) | ORAL | Status: DC
  Administered 2020-06-09 – 2020-06-12 (×9): 10 mg via ORAL
  Filled 2020-06-09 (×13): qty 1

## 2020-06-09 MED ORDER — SODIUM CHLORIDE 0.9% FLUSH
3.0000 mL | Freq: Two times a day (BID) | INTRAVENOUS | Status: DC
  Administered 2020-06-09 – 2020-06-12 (×7): 3 mL via INTRAVENOUS

## 2020-06-09 NOTE — ED Notes (Signed)
EDP at bedside for ultrasound IV placement.

## 2020-06-09 NOTE — ED Notes (Signed)
Dr Agbata at bedside. 

## 2020-06-09 NOTE — ED Notes (Signed)
Provider at bedside
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
## 2020-06-09 NOTE — ED Provider Notes (Signed)
Emergency Medicine Provider Triage Evaluation Note  Richard Malone , a 55 y.o. male  was evaluated in triage.  Pt complains of hematemesis, history of ulcers.  Takes Protonix and Plavix.  Review of Systems  Positive: Hematemesis, bloody stools Negative: Chest pain, shortness of breath  Physical Exam  BP (!) 152/92 (BP Location: Left Arm)   Pulse 81   Resp 18   Wt 122.4 kg   SpO2 100%   BMI 38.72 kg/m  Gen:   Awake, mild distress   Resp:  Normal effort  MSK:   Moves extremities without difficulty  Other:    Medical Decision Making  Medically screening exam initiated at 6:43 AM.  Appropriate orders placed.  Meryl Crutch was informed that the remainder of the evaluation will be completed by another provider, this initial triage assessment does not replace that evaluation, and the importance of remaining in the ED until their evaluation is complete.  Also ordered IV Protonix bolus with drip, type and screen, PT/INR.   Irean Hong, MD 06/09/20 [phone]
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
## 2020-06-09 NOTE — ED Notes (Signed)
Pt sking for water to drink. Asked Dr Joylene Igo, pt still strictly NPO. Pt informed.

## 2020-06-09 NOTE — ED Triage Notes (Signed)
Pt arrived via [facility] from local hotel where pt had 3 episodes of bloody emesis this AM and reports blood in stool x1 day.

## 2020-06-09 NOTE — ED Notes (Signed)
Overdue meds not yet verified by pharmacy. 

## 2020-06-10 DIAGNOSIS — K625 Hemorrhage of anus and rectum: Secondary | ICD-10-CM

## 2020-06-10 DIAGNOSIS — K922 Gastrointestinal hemorrhage, unspecified: Secondary | ICD-10-CM | POA: Diagnosis present

## 2020-06-10 DIAGNOSIS — K920 Hematemesis: Secondary | ICD-10-CM

## 2020-06-10 LAB — GLUCOSE, CAPILLARY
Glucose-Capillary: 107 mg/dL — ABNORMAL HIGH (ref 70–99)
Glucose-Capillary: 117 mg/dL — ABNORMAL HIGH (ref 70–99)
Glucose-Capillary: 123 mg/dL — ABNORMAL HIGH (ref 70–99)
Glucose-Capillary: 124 mg/dL — ABNORMAL HIGH (ref 70–99)
Glucose-Capillary: 161 mg/dL — ABNORMAL HIGH (ref 70–99)

## 2020-06-10 LAB — MAGNESIUM: Magnesium: 1.7 mg/dL (ref 1.7–2.4)

## 2020-06-10 LAB — BASIC METABOLIC PANEL
Anion gap: 8 (ref 5–15)
BUN: <5 mg/dL — ABNORMAL LOW (ref 6–20)
CO2: 23 mmol/L (ref 22–32)
Calcium: 7.8 mg/dL — ABNORMAL LOW (ref 8.9–10.3)
Chloride: 106 mmol/L (ref 98–111)
Creatinine, Ser: 0.91 mg/dL (ref 0.61–1.24)
GFR, Estimated: >60 mL/min (ref >60)
Glucose, Bld: 114 mg/dL — ABNORMAL HIGH (ref 70–99)
Potassium: 3.6 mmol/L (ref 3.5–5.1)
Sodium: 137 mmol/L (ref 135–145)

## 2020-06-10 LAB — HEMOGLOBIN AND HEMATOCRIT, BLOOD
HCT: 31.9 % — ABNORMAL LOW (ref 39.0–52.0)
HCT: 37.3 % — ABNORMAL LOW (ref 39.0–52.0)
Hemoglobin: 9.8 g/dL — ABNORMAL LOW (ref 13.0–17.0)
Hemoglobin: 11.1 g/dL — ABNORMAL LOW (ref 13.0–17.0)

## 2020-06-10 LAB — FOLATE: Folate: 19.3 ng/mL (ref >5.9)

## 2020-06-10 LAB — IRON AND TIBC
Iron: 29 ug/dL — ABNORMAL LOW (ref 45–182)
Saturation Ratios: 8 % — ABNORMAL LOW (ref 17.9–39.5)
TIBC: 365 ug/dL (ref 250–450)
UIBC: 336 ug/dL

## 2020-06-10 LAB — FERRITIN: Ferritin: 11 ng/mL — ABNORMAL LOW (ref 24–336)

## 2020-06-10 MED ORDER — SODIUM CHLORIDE 0.9 % IV SOLN: INTRAVENOUS | Status: DC

## 2020-06-10 MED ORDER — POLYETHYLENE GLYCOL 3350 17 GM/SCOOP PO POWD
1.0000 | Freq: Once | ORAL | Status: AC
  Administered 2020-06-10: 19:00:00 255 g via ORAL
  Filled 2020-06-10: qty 255

## 2020-06-10 MED ORDER — MAGNESIUM CITRATE PO SOLN
1.0000 | Freq: Once | ORAL | Status: AC
  Administered 2020-06-10: 1 via ORAL
  Filled 2020-06-10: qty 296
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
## 2020-06-10 NOTE — TOC Initial Note (Signed)
Transition of Care [facility]) - Initial/Assessment Note    Patient Details  Name: Richard Malone MRN: [id number] Date of Birth: [birth year]  Transition of Care [facility]) CM/SW Contact:    Shelbie Hutching, RN Phone Number: 06/10/2020, 2:12 PM  Clinical Narrative:                 Patient placed under observation for GI bleeding.  Patient scheduled for EGD tomorrow.  RNCM met with patient at the bedside for consult. Patient reports that he is from [place] and he was on his way to work in [place] with 2 other people when he started having chest pain.  Patient reports they dropped him off at the hospital and then they took off with his clothes and wallet and left him here.  Patient now has no money and no way back to [place].  He was staying at a hotel for a couple of days after last discharge from the hospital with some money that his sister was able to get up here.   Patient needs assistance with getting back to [place] and he needs some clothes.  RNCM can provide patient with a set of clothes at discharge from the [facility] donation closet.  RNCM will see about how to get patient a bus ticket, there is a Manufacturing engineer station in [place].  TOC to cont to follow.    Expected Discharge Plan: Home/Self Care Barriers to Discharge: Continued Medical Work up   Patient Goals and CMS Choice Patient states their goals for this hospitalization and ongoing recovery are:: Patient is from [place] but not sure how he is going to get home      Expected Discharge Plan and Services Expected Discharge Plan: Home/Self Care   Discharge Planning Services: CM Consult   Living arrangements for the past 2 months: Single Family Home                 DME Arranged: N/A DME Agency: NA       HH Arranged: NA          Prior Living Arrangements/Services Living arrangements for the past 2 months: Single Family Home Lives with:: Self Patient language and need for interpreter reviewed:: Yes Do you feel safe going back to  the place where you live?: Yes      Need for Family Participation in Patient Care: Yes (Comment) (stuck in [place] with no money and no car) Care giver support system in place?: No (comment)   Criminal Activity/Legal Involvement Pertinent to Current Situation/Hospitalization: No - Comment as needed  Activities of Daily Living Home Assistive Devices/Equipment: None ADL Screening (condition at time of admission) Patient's cognitive ability adequate to safely complete daily activities?: Yes Is the patient deaf or have difficulty hearing?: No Does the patient have difficulty seeing, even when wearing glasses/contacts?: No Does the patient have difficulty concentrating, remembering, or making decisions?: No Patient able to express need for assistance with ADLs?: Yes Does the patient have difficulty dressing or bathing?: No Independently performs ADLs?: Yes (appropriate for developmental age) Does the patient have difficulty walking or climbing stairs?: No Weakness of Legs: None Weakness of Arms/Hands: None  Permission Sought/Granted   Permission granted to share information with : No              Emotional Assessment Appearance:: Appears stated age Attitude/Demeanor/Rapport: Engaged Affect (typically observed): Accepting Orientation: : Oriented to Self,Oriented to Place,Oriented to  Time,Oriented to Situation Alcohol / Substance Use: Not Applicable Psych Involvement: No (comment)  Admission diagnosis:  GI bleed [K92.2] Hematemesis, presence of nausea not specified [K92.0] Patient Active Problem List   Diagnosis Date Noted  . GI bleed 06/09/2020  . Chronic diastolic CHF (congestive heart failure) ([place]) 06/09/2020  . Obesity   . HTN (hypertension)   . Type 2 diabetes mellitus without complication ([place])   . Chest pain 06/03/2020  . CAD (coronary artery disease) 06/03/2020  . CHF (congestive heart failure) ([place]) 06/03/2020  . Unstable angina (HCC)    PCP:  Pcp, No Pharmacy:  No  Pharmacies Listed    Social Determinants of Health (SDOH) Interventions    Readmission Risk Interventions No flowsheet data found.

## 2020-06-11 ENCOUNTER — Inpatient Hospital Stay: Payer: Self-pay | Admitting: Registered Nurse

## 2020-06-11 ENCOUNTER — Encounter
Admission: EM | Disposition: A | Discharge status: Home / Self Care | Payer: Self-pay | Source: Home / Self Care | Attending: Internal Medicine

## 2020-06-11 ENCOUNTER — Encounter: Payer: Self-pay | Admitting: Internal Medicine

## 2020-06-11 HISTORY — PX: COLONOSCOPY WITH PROPOFOL: SHX5780

## 2020-06-11 HISTORY — PX: ESOPHAGOGASTRODUODENOSCOPY (EGD) WITH PROPOFOL: SHX5813

## 2020-06-11 LAB — HEMOGLOBIN AND HEMATOCRIT, BLOOD
HCT: 31 % — ABNORMAL LOW (ref 39.0–52.0)
HCT: 34.4 % — ABNORMAL LOW (ref 39.0–52.0)
Hemoglobin: 9.5 g/dL — ABNORMAL LOW (ref 13.0–17.0)
Hemoglobin: 10.3 g/dL — ABNORMAL LOW (ref 13.0–17.0)

## 2020-06-11 LAB — BASIC METABOLIC PANEL
Anion gap: 6 (ref 5–15)
BUN: <5 mg/dL — ABNORMAL LOW (ref 6–20)
CO2: 23 mmol/L (ref 22–32)
Calcium: 7.8 mg/dL — ABNORMAL LOW (ref 8.9–10.3)
Chloride: 109 mmol/L (ref 98–111)
Creatinine, Ser: 0.9 mg/dL (ref 0.61–1.24)
GFR, Estimated: >60 mL/min (ref >60)
Glucose, Bld: 104 mg/dL — ABNORMAL HIGH (ref 70–99)
Potassium: 3.8 mmol/L (ref 3.5–5.1)
Sodium: 138 mmol/L (ref 135–145)

## 2020-06-11 LAB — GLUCOSE, CAPILLARY
Glucose-Capillary: 112 mg/dL — ABNORMAL HIGH (ref 70–99)
Glucose-Capillary: 105 mg/dL — ABNORMAL HIGH (ref 70–99)
Glucose-Capillary: 155 mg/dL — ABNORMAL HIGH (ref 70–99)
Glucose-Capillary: 122 mg/dL — ABNORMAL HIGH (ref 70–99)

## 2020-06-11 LAB — VITAMIN B12: Vitamin B-12: 416 pg/mL (ref 180–914)

## 2020-06-11 SURGERY — ESOPHAGOGASTRODUODENOSCOPY (EGD) WITH PROPOFOL
Anesthesia: General

## 2020-06-11 MED ORDER — ASPIRIN EC 81 MG PO TBEC
81.0000 mg | DELAYED_RELEASE_TABLET | Freq: Every day | ORAL | Status: DC
  Administered 2020-06-12 – 2020-06-13 (×2): 81 mg via ORAL
  Filled 2020-06-11 (×2): qty 1

## 2020-06-11 MED ORDER — PROPOFOL 10 MG/ML IV BOLUS
INTRAVENOUS | Status: DC | PRN
  Administered 2020-06-11 (×2): 25 mg via INTRAVENOUS
  Administered 2020-06-11: 50 mg via INTRAVENOUS

## 2020-06-11 MED ORDER — LIDOCAINE HCL (CARDIAC) PF 100 MG/5ML IV SOSY
PREFILLED_SYRINGE | INTRAVENOUS | Status: DC | PRN
  Administered 2020-06-11: 40 mg via INTRAVENOUS

## 2020-06-11 MED ORDER — PROPOFOL 500 MG/50ML IV EMUL
INTRAVENOUS | Status: AC
  Filled 2020-06-11: qty 50

## 2020-06-11 MED ORDER — PROPOFOL 500 MG/50ML IV EMUL
INTRAVENOUS | Status: DC | PRN
  Administered 2020-06-11: 10 ug/kg/min via INTRAVENOUS

## 2020-06-11 MED ORDER — PANTOPRAZOLE SODIUM 40 MG PO TBEC
40.0000 mg | DELAYED_RELEASE_TABLET | Freq: Two times a day (BID) | ORAL | Status: DC
  Administered 2020-06-11 – 2020-06-13 (×4): 40 mg via ORAL
  Filled 2020-06-11 (×5): qty 1

## 2020-06-11 MED ORDER — LIDOCAINE HCL (PF) 2 % IJ SOLN
INTRAMUSCULAR | Status: AC
  Filled 2020-06-11: qty 2

## 2020-06-11 MED ORDER — SODIUM CHLORIDE 0.9 % IV SOLN
300.0000 mg | Freq: Once | INTRAVENOUS | Status: AC
  Administered 2020-06-11: 300 mg via INTRAVENOUS
  Filled 2020-06-11 (×2): qty 15

## 2020-06-11 NOTE — Op Note (Signed)
[facility] Gastroenterology Patient Name: Richard Malone Procedure Date: 06/11/2020 11:41 AM MRN: [id number] Account #: [id number] Date of Birth: [birth year] Admit Type: Outpatient Age: 55 Room: [facility] ENDO ROOM 2 Gender: Male Note Status: Finalized Procedure:             Upper GI endoscopy Indications:           Hematemesis Providers:             Toney Reil MD, MD Referring MD:          No Local Md, MD (Referring MD) Medicines:             General Anesthesia Complications:         No immediate complications. Estimated blood loss: None. Procedure:             Pre-Anesthesia Assessment:                        - Prior to the procedure, a History and Physical was                         performed, and patient medications and allergies were                         reviewed. The patient is competent. The risks and                         benefits of the procedure and the sedation options and                         risks were discussed with the patient. All questions                         were answered and informed consent was obtained.                         Patient identification and proposed procedure were                         verified by the physician, the nurse, the                         anesthesiologist, the anesthetist and the technician                         in the pre-procedure area in the procedure room in the                         endoscopy suite. Mental Status Examination: alert and                         oriented. Airway Examination: normal oropharyngeal                         airway and neck mobility. Respiratory Examination:                         clear to auscultation. CV Examination: normal.  Prophylactic Antibiotics: The patient does not require                         prophylactic antibiotics. Prior Anticoagulants: The                         patient has taken Plavix (clopidogrel), last dose was                          3 days prior to procedure. ASA Grade Assessment: III -                         A patient with severe systemic disease. After                         reviewing the risks and benefits, the patient was                         deemed in satisfactory condition to undergo the                         procedure. The anesthesia plan was to use general                         anesthesia. Immediately prior to administration of                         medications, the patient was re-assessed for adequacy                         to receive sedatives. The heart rate, respiratory                         rate, oxygen saturations, blood pressure, adequacy of                         pulmonary ventilation, and response to care were                         monitored throughout the procedure. The physical                         status of the patient was re-assessed after the                         procedure.                        After obtaining informed consent, the endoscope was                         passed under direct vision. Throughout the procedure,                         the patient's blood pressure, pulse, and oxygen                         saturations were monitored continuously. The Endoscope  was introduced through the mouth, and advanced to the                         second part of duodenum. The upper GI endoscopy was                         accomplished without difficulty. The patient tolerated                         the procedure well. Findings:      The duodenal bulb and second portion of the duodenum were normal.      A few localized small erosions with no bleeding and no stigmata of       recent bleeding were found in the gastric fundus.      Patchy mildly erythematous mucosa without bleeding was found in the       gastric antrum. Biopsies were taken with a cold forceps for Helicobacter       pylori testing.      The gastric body was normal. Biopsies  were taken with a cold forceps for       Helicobacter pylori testing.      There were esophageal mucosal changes secondary to established       short-segment Barrett's disease present in the lower third of the       esophagus. The maximum longitudinal extent of these mucosal changes was       3 cm in length.      Two superficial esophageal ulcers with no bleeding from biopsy sites and       no stigmata of recent bleeding were found at the gastroesophageal       junction. The largest lesion was 3 mm in largest dimension. Impression:            - Normal duodenal bulb and second portion of the                         duodenum.                        - Erosive gastropathy with no bleeding and no stigmata                         of recent bleeding.                        - Erythematous mucosa in the antrum. Biopsied.                        - Normal gastric body. Biopsied.                        - Esophageal mucosal changes secondary to established                         short-segment Barrett's disease.                        - Esophageal ulcers with no bleeding and no stigmata                         of recent bleeding. Recommendation:        -  Await pathology results.                        - Follow an antireflux regimen for the rest of the                         patient's life.                        - Use Protonix (pantoprazole) 40 mg PO BID                         indefinitely.                        - Proceed with colonoscopy as scheduled                        See colonoscopy report Procedure Code(s):     --- Professional ---                        [phone], Esophagogastroduodenoscopy, flexible,                         transoral; with biopsy, single or multiple Diagnosis Code(s):     --- Professional ---                        K22.70, Barrett's esophagus without dysplasia                        K31.89, Other diseases of stomach and duodenum                        K22.10, Ulcer of  esophagus without bleeding                        K92.0, Hematemesis CPT copyright 2019 American [employer] Association. All rights reserved. The codes documented in this report are preliminary and upon coder review may  be revised to meet current compliance requirements. Dr. Libby Maw Toney Reil MD, MD 06/11/2020 12:02:10 PM This report has been signed electronically. Number of Addenda: 0 Note Initiated On: 06/11/2020 11:41 AM Estimated Blood Loss:  Estimated blood loss: none.      [facility]

## 2020-06-11 NOTE — Progress Notes (Addendum)
Met patient while rounding, patient shared he was dumped here. He is from [facility] the individuals he has known for years left him, taking his vehicle, money and property in car. He says he has spoken with CSW.  He has no guarantees from a friend who is attempting to raise funds for a ticket home. He states CSW says there is a clothes closet on 3rd floor he may be able to get change of clothes from. Will follow up.

## 2020-06-11 NOTE — Op Note (Signed)
[facility] Gastroenterology Patient Name: Richard Malone Procedure Date: 06/11/2020 11:39 AM MRN: [id number] Account #: [id number] Date of Birth: 1965/11/25 Admit Type: Inpatient Age: 55 Room: [facility] ENDO ROOM 2 Gender: Male Note Status: Finalized Procedure:             Colonoscopy Indications:           This is the patient's first colonoscopy, Rectal                         bleeding Providers:             Toney Reil MD, MD Referring MD:          No Local Md, MD (Referring MD) Medicines:             General Anesthesia Complications:         No immediate complications. Estimated blood loss: None. Procedure:             Pre-Anesthesia Assessment:                        - Prior to the procedure, a History and Physical was                         performed, and patient medications and allergies were                         reviewed. The patient is competent. The risks and                         benefits of the procedure and the sedation options and                         risks were discussed with the patient. All questions                         were answered and informed consent was obtained.                         Patient identification and proposed procedure were                         verified by the physician, the nurse, the                         anesthesiologist, the anesthetist and the technician                         in the pre-procedure area in the procedure room in the                         endoscopy suite. Mental Status Examination: alert and                         oriented. Airway Examination: normal oropharyngeal                         airway and neck mobility. Respiratory Examination:  clear to auscultation. CV Examination: normal.                         Prophylactic Antibiotics: The patient does not require                         prophylactic antibiotics. Prior Anticoagulants: The                          patient has taken Plavix (clopidogrel), last dose was                         3 days prior to procedure. ASA Grade Assessment: III -                         A patient with severe systemic disease. After                         reviewing the risks and benefits, the patient was                         deemed in satisfactory condition to undergo the                         procedure. The anesthesia plan was to use general                         anesthesia. Immediately prior to administration of                         medications, the patient was re-assessed for adequacy                         to receive sedatives. The heart rate, respiratory                         rate, oxygen saturations, blood pressure, adequacy of                         pulmonary ventilation, and response to care were                         monitored throughout the procedure. The physical                         status of the patient was re-assessed after the                         procedure.                        After obtaining informed consent, the colonoscope was                         passed under direct vision. Throughout the procedure,                         the patient's blood pressure, pulse, and oxygen  saturations were monitored continuously. The                         Colonoscope was introduced through the anus and                         advanced to the the cecum, identified by appendiceal                         orifice and ileocecal valve. The colonoscopy was                         performed with moderate difficulty due to significant                         looping and the patient's body habitus. Successful                         completion of the procedure was aided by applying                         abdominal pressure. The patient tolerated the                         procedure well. The quality of the bowel preparation                         was good. Findings:       The perianal and digital rectal examinations were normal. Pertinent       negatives include normal sphincter tone and no palpable rectal lesions.      Scattered diverticula were found in the entire colon.      Non-bleeding external hemorrhoids were found during retroflexion. The       hemorrhoids were large, source of rectal bleeding.      The exam was otherwise without abnormality. Impression:            - Diverticulosis in the entire examined colon.                        - Non-bleeding external hemorrhoids.                        - The examination was otherwise normal.                        - No specimens collected. Recommendation:        - Return patient to hospital ward for possible                         discharge same day.                        - Cardiac diet and diabetic (ADA) diet today.                        - Continue present medications.                        - Resume Plavix (clopidogrel) today at prior dose. Procedure Code(s):     ---  Professional ---                        [phone], Colonoscopy, flexible; diagnostic, including                         collection of specimen(s) by brushing or washing, when                         performed (separate procedure) Diagnosis Code(s):     --- Professional ---                        K64.4, Residual hemorrhoidal skin tags                        K62.5, Hemorrhage of anus and rectum                        K57.30, Diverticulosis of large intestine without                         perforation or abscess without bleeding CPT copyright 2019 American Medical Association. All rights reserved. The codes documented in this report are preliminary and upon coder review may  be revised to meet current compliance requirements. Dr. Libby Maw Toney Reil MD, MD 06/11/2020 12:24:40 PM This report has been signed electronically. Number of Addenda: 0 Note Initiated On: 06/11/2020 11:39 AM Scope Withdrawal Time: 0 hours 9 minutes 55 seconds   Total Procedure Duration: 0 hours 18 minutes 28 seconds  Estimated Blood Loss:  Estimated blood loss: none.      [facility]

## 2020-06-11 NOTE — Transfer of Care (Signed)
Immediate Anesthesia Transfer of Care Note  Patient: Richard Malone  Procedure(s) Performed: ESOPHAGOGASTRODUODENOSCOPY (EGD) WITH PROPOFOL (N/A ) COLONOSCOPY WITH PROPOFOL (N/A )  Patient Location: PACU  Anesthesia Type:General  Level of Consciousness: drowsy and patient cooperative  Airway & Oxygen Therapy: Patient Spontanous Breathing and Patient connected to nasal cannula oxygen  Post-op Assessment: Report given to RN and Post -op Vital signs reviewed and stable  Post vital signs: Reviewed and stable  Last Vitals:  Vitals Value Taken Time  BP 93/60 06/11/20 1227  Temp    Pulse 62 06/11/20 1227  Resp 14 06/11/20 1227  SpO2 98 % 06/11/20 1227    Last Pain:  Vitals:   06/11/20 1105  TempSrc: Temporal  PainSc: 0-No pain      Patients Stated Pain Goal: 0 (06/10/20 1407)  Complications: No complications documented.

## 2020-06-11 NOTE — TOC Progression Note (Signed)
Transition of Care [facility]) - Progression Note    Patient Details  Name: Richard Malone MRN: [id number] Date of Birth: Jan 13, 1965  Transition of Care [facility]) CM/SW Contact  Allayne Butcher, RN Phone Number: 06/11/2020, 4:57 PM  Clinical Narrative:    Patient reports that he has a friend that is working on getting him a bus ride for tomorrow when he is discharged he will just need a ride to the bus station which [facility] can assist with.  RNCM has provided patient with clothes, T shirt, shorts, socks, underwear and a pair of flip flops.     Expected Discharge Plan: Home/Self Care Barriers to Discharge: Continued Medical Work up  Expected Discharge Plan and Services Expected Discharge Plan: Home/Self Care   Discharge Planning Services: CM Consult   Living arrangements for the past 2 months: Single Family Home                 DME Arranged: N/A DME Agency: NA       HH Arranged: NA           Social Determinants of Health (SDOH) Interventions    Readmission Risk Interventions No flowsheet data found.

## 2020-06-11 NOTE — Anesthesia Preprocedure Evaluation (Signed)
Anesthesia Evaluation  Patient identified by MRN, date of birth, ID band Patient awake    Reviewed: Allergy & Precautions, H&P , NPO status , Patient's Chart, lab work & pertinent test results, reviewed documented beta blocker date and time   Airway Mallampati: III   Neck ROM: full    Dental  (+) Teeth Intact   Pulmonary neg pulmonary ROS, Patient abstained from smoking., former smoker,    Pulmonary exam normal        Cardiovascular Exercise Tolerance: Good hypertension, On Medications + angina with exertion + CAD, + Past MI and +CHF  Normal cardiovascular exam Rhythm:regular Rate:Normal     Neuro/Psych negative neurological ROS  negative psych ROS   GI/Hepatic negative GI ROS, Neg liver ROS,   Endo/Other  diabetes, Well Controlled, Type 2, Oral Hypoglycemic AgentsMorbid obesity  Renal/GU negative Renal ROS  negative genitourinary   Musculoskeletal   Abdominal   Peds  Hematology negative hematology ROS (+)   Anesthesia Other Findings Past Medical History: No date: CAD (coronary artery disease)     Comment:  a. s/p first MI @ age 41; b. 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 reports h/o 16 stents; d. last cath ~ 4 yrs ago in               [place]. No date: HTN (hypertension) No date: Hyperlipidemia LDL goal <70     Comment:  a. Statin/PCSK9i intolerant. Does not want to try               alternate agents. No date: MI (myocardial infarction) (HCC) No date: Obesity Past Surgical History: No date: CHOLECYSTECTOMY No date: CORONARY ARTERY BYPASS GRAFT 06/07/2020: ESOPHAGOGASTRODUODENOSCOPY (EGD) WITH PROPOFOL; N/A     Comment:  Procedure: ESOPHAGOGASTRODUODENOSCOPY (EGD) WITH               PROPOFOL;  Surgeon: Toledo, Boykin Nearing, MD;  Location:               [facility] ENDOSCOPY;  Service: Gastroenterology;  Laterality:               N/A; 06/03/2020: LEFT HEART CATH AND CORS/GRAFTS ANGIOGRAPHY; N/A     Comment:   Procedure: LEFT HEART CATH AND CORS/GRAFTS ANGIOGRAPHY;               Surgeon: Yvonne Kendall, MD;  Location: [facility] INVASIVE               CV LAB;  Service: Cardiovascular;  Laterality: N/A; 06/03/2020: THORACIC AORTOGRAM; N/A     Comment:  Procedure: THORACIC AORTOGRAM;  Surgeon: Yvonne Kendall, MD;  Location: [facility] INVASIVE CV LAB;                Service: Cardiovascular;  Laterality: N/A; No date: TONSILLECTOMY BMI    Body Mass Index: 38.74 kg/m     Reproductive/Obstetrics negative OB ROS                             Anesthesia Physical Anesthesia Plan  ASA: III and emergent  Anesthesia Plan: General   Post-op Pain Management:    Induction:   PONV Risk Score and Plan:   Airway Management Planned:   Additional Equipment:   Intra-op Plan:   Post-operative Plan:   Informed Consent: I have reviewed  the patients History and Physical, chart, labs and discussed the procedure including the risks, benefits and alternatives for the proposed anesthesia with the patient or authorized representative who has indicated his/her understanding and acceptance.     Dental Advisory Given  Plan Discussed with: CRNA  Anesthesia Plan Comments:         Anesthesia Quick Evaluation

## 2020-06-11 NOTE — Progress Notes (Signed)
PROGRESS NOTE    Richard Malone  NLG:[id number] DOB: [birth year] DOA: 06/09/2020 PCP: Pcp, No   Brief Narrative: 55 year old with past medical history significant for CAD s/p CABG and PCI with multiple stents, patient is status post recent cardiac cath which showed severe multiple vessel disease, no interventional targets.  Medical management recommended.  History of chronic diastolic heart failure.  Patient presented to emergency from for evaluation of 3 episode of hematemesis.  Of note patient with recent admission had hematochezia and underwent endoscopy which showed gastritis and evidence of Barrett's esophagus.  Patient presented with a hemoglobin of 11--10--GI has been consulted plan for endoscopy and colonoscopy tomorrow.   Assessment & Plan:   Principal Problem:   GI bleed Active Problems:   CAD (coronary artery disease)   Chronic diastolic CHF (congestive heart failure) (HCC)   Obesity   HTN (hypertension)   Type 2 diabetes mellitus without complication (HCC)   Acute GI bleeding   1-GI bleed, hematochezia: -Received IV Protonix drip, now on oral Protonix.  -Continue to monitor hemoglobin,11--10--9.8--9.5 -GI consulted,Endoscopy: Duodenal bulb and second portion of the duodenum were normal, localized to small erosion with no bleeding and no stigmata of recent bleeding were found in the gastric fundus.  Esophageal mucosal changes secondary to established short segment Barrett's disease in the lower third of the esophagus.  2 superficial esophageal ulcers with no bleeding from biopsy site. colonoscopy:, nonbleeding external hemorrhoids examination otherwise normal -Per cardiology okay to change aspirin to baby aspirin and discontinue Plavix.  Continue to hold blood thinner for now -CT angio abdomen pelvis did not show any active bleeding source. -GI okay to resume baby aspirin  2-history of CAD, recent admission for non-STEMI: Continue to hold aspirin and Plavix Continue with  metoprolol and nitrates and Ranexa Per cardiology Dr.: Molli Knock to stop Plavix and change aspirin to baby aspirin Plan to resume aspirin  3-Chronic Diastolic Heart failure/Hypertension Continue with lisinopril and metoprolol  4-Obesity: Need lifestyle modification.  5-Diabetes: Continue with a sliding scale insulin    Estimated body mass index is 38.74 kg/m as calculated from the following:   Height as of this encounter: 5\' 10"  (1.778 m).   Weight as of this encounter: 122.5 kg.   DVT prophylaxis: SCD Code Status: Full code Family Communication: care discussed with patient.  Disposition Plan:  Status is: Observation  The patient will require care spanning > 2 midnights and should be moved to inpatient because: IV treatments appropriate due to intensity of illness or inability to take PO  Dispo: The patient is from: Home              Anticipated d/c is to: Home              Patient currently is not medically stable to d/c.   Difficult to place patient No        Consultants:   GI    Procedures:  None Antimicrobials:    Subjective: He reported a small amount of blood in the stool after bowel prep.  He vomited today but no blood.  Objective: Vitals:   06/11/20 1227 06/11/20 1235 06/11/20 1245 06/11/20 1346  BP: 93/60  122/83 135/78  Pulse: 62 60 (!) 59 63  Resp: 14 16 16 20   Temp:  98.6 F (37 C)  99 F (37.2 C)  TempSrc:  Temporal    SpO2: 98%     Weight:      Height:  Intake/Output Summary (Last 24 hours) at 06/11/2020 1414 Last data filed at 06/11/2020 1223 Gross per 24 hour  Intake 500 ml  Output --  Net 500 ml   Filed Weights   06/09/20 0627 06/11/20 1105  Weight: 122.4 kg 122.5 kg    Examination:  General exam: NAD Respiratory system: CTA Cardiovascular system: S 1, S 2 RRR. Gastrointestinal system: BS present, soft, nt Central nervous system: alert Extremities: symmetric power    Data Reviewed: I have personally reviewed  following labs and imaging studies  CBC: Recent Labs  Lab 06/07/20 0705 06/09/20 0643 06/09/20 0944 06/09/20 1717 06/10/20 0456 06/10/20 1934 06/11/20 0429  WBC 8.5 12.1*  --   --   --   --   --   NEUTROABS  --  8.9*  --   --   --   --   --   HGB 10.0* 11.8* 10.5* 10.2* 9.8* 11.1* 9.5*  HCT 33.5* 39.1 34.2* 33.7* 31.9* 37.3* 31.0*  MCV 77.5* 78.7*  --   --   --   --   --   PLT 312 366  --   --   --   --   --    Basic Metabolic Panel: Recent Labs  Lab 06/09/20 0634 06/10/20 0456 06/11/20 0429  NA 136 137 138  K 4.1 3.6 3.8  CL 101 106 109  CO2 24 23 23   GLUCOSE 172* 114* 104*  BUN 8 <5* <5*  CREATININE 1.09 0.91 0.90  CALCIUM 8.9 7.8* 7.8*  MG  --  1.7  --    GFR: Estimated Creatinine Clearance: 124.6 mL/min (by C-G formula based on SCr of 0.9 mg/dL). Liver Function Tests: Recent Labs  Lab 06/09/20 0634  AST 22  ALT 13  ALKPHOS 77  BILITOT 0.7  PROT 8.4*  ALBUMIN 4.1   Recent Labs  Lab 06/09/20 0634  LIPASE 29   No results for input(s): AMMONIA in the last 168 hours. Coagulation Profile: No results for input(s): INR, PROTIME in the last 168 hours. Cardiac Enzymes: No results for input(s): CKTOTAL, CKMB, CKMBINDEX, TROPONINI in the last 168 hours. BNP (last 3 results) No results for input(s): PROBNP in the last 8760 hours. HbA1C: No results for input(s): HGBA1C in the last 72 hours. CBG: Recent Labs  Lab 06/10/20 0744 06/10/20 1140 06/10/20 1615 06/11/20 0550 06/11/20 0818  GLUCAP 107* 123* 161* 112* 105*   Lipid Profile: No results for input(s): CHOL, HDL, LDLCALC, TRIG, CHOLHDL, LDLDIRECT in the last 72 hours. Thyroid Function Tests: No results for input(s): TSH, T4TOTAL, FREET4, T3FREE, THYROIDAB in the last 72 hours. Anemia Panel: Recent Labs    06/10/20 0456 06/10/20 1934  VITAMINB12  --  416  FOLATE 19.3  --   FERRITIN 11*  --   TIBC 365  --   IRON 29*  --    Sepsis Labs: No results for input(s): PROCALCITON, LATICACIDVEN in  the last 168 hours.  Recent Results (from the past 240 hour(s))  SARS CORONAVIRUS 2 (TAT 6-24 HRS) Nasopharyngeal Nasopharyngeal Swab     Status: None   Collection Time: 06/03/20  5:37 AM   Specimen: Nasopharyngeal Swab  Result Value Ref Range Status   SARS Coronavirus 2 NEGATIVE NEGATIVE Final    Comment: (NOTE) SARS-CoV-2 target nucleic acids are NOT DETECTED.  The SARS-CoV-2 RNA is generally detectable in upper and lower respiratory specimens during the acute phase of infection. Negative results do not preclude SARS-CoV-2 infection, do not rule out co-infections  with other pathogens, and should not be used as the sole basis for treatment or other patient management decisions. Negative results must be combined with clinical observations, patient history, and epidemiological information. The expected result is Negative.  Fact Sheet for Patients: HairSlick.no  Fact Sheet for Healthcare Providers: quierodirigir.com  This test is not yet approved or cleared by the [place] FDA and  has been authorized for detection and/or diagnosis of SARS-CoV-2 by FDA under an Emergency Use Authorization (EUA). This EUA will remain  in effect (meaning this test can be used) for the duration of the COVID-19 declaration under Se ction 564(b)(1) of the Act, 21 U.S.C. section 360bbb-3(b)(1), unless the authorization is terminated or revoked sooner.  Performed at [facility], [address] [place], [place] [address]   Resp Panel by RT-PCR (Flu A&B, Covid) Nasopharyngeal Swab     Status: None   Collection Time: 06/09/20  7:20 AM   Specimen: Nasopharyngeal Swab; Nasopharyngeal(NP) swabs in vial transport medium  Result Value Ref Range Status   SARS Coronavirus 2 by RT PCR NEGATIVE NEGATIVE Final    Comment: (NOTE) SARS-CoV-2 target nucleic acids are NOT DETECTED.  The SARS-CoV-2 RNA is generally detectable in upper  respiratory specimens during the acute phase of infection. The lowest concentration of SARS-CoV-2 viral copies this assay can detect is 138 copies/mL. A negative result does not preclude SARS-Cov-2 infection and should not be used as the sole basis for treatment or other patient management decisions. A negative result may occur with  improper specimen collection/handling, submission of specimen other than nasopharyngeal swab, presence of viral mutation(s) within the areas targeted by this assay, and inadequate number of viral copies(<138 copies/mL). A negative result must be combined with clinical observations, patient history, and epidemiological information. The expected result is Negative.  Fact Sheet for Patients:  BloggerCourse.com  Fact Sheet for Healthcare Providers:  SeriousBroker.it  This test is no t yet approved or cleared by the [place] FDA and  has been authorized for detection and/or diagnosis of SARS-CoV-2 by FDA under an Emergency Use Authorization (EUA). This EUA will remain  in effect (meaning this test can be used) for the duration of the COVID-19 declaration under Section 564(b)(1) of the Act, 21 U.S.C.section 360bbb-3(b)(1), unless the authorization is terminated  or revoked sooner.       Influenza A by PCR NEGATIVE NEGATIVE Final   Influenza B by PCR NEGATIVE NEGATIVE Final    Comment: (NOTE) The Xpert Xpress SARS-CoV-2/FLU/RSV plus assay is intended as an aid in the diagnosis of influenza from Nasopharyngeal swab specimens and should not be used as a sole basis for treatment. Nasal washings and aspirates are unacceptable for Xpert Xpress SARS-CoV-2/FLU/RSV testing.  Fact Sheet for Patients: BloggerCourse.com  Fact Sheet for Healthcare Providers: SeriousBroker.it  This test is not yet approved or cleared by the [place] FDA and has been  authorized for detection and/or diagnosis of SARS-CoV-2 by FDA under an Emergency Use Authorization (EUA). This EUA will remain in effect (meaning this test can be used) for the duration of the COVID-19 declaration under Section 564(b)(1) of the Act, 21 U.S.C. section 360bbb-3(b)(1), unless the authorization is terminated or revoked.  Performed at [facility], [address] [place], [place] [address]          Radiology Studies: CT Angio Abd/Pel w/ and/or w/o  Result Date: 06/09/2020 CLINICAL DATA:  55 year old male with epigastric pain. Concern for mesenteric ischemia. EXAM: CTA ABDOMEN AND PELVIS  WITHOUT AND WITH CONTRAST TECHNIQUE: Multidetector CT imaging of the abdomen and pelvis was performed using the standard protocol during bolus administration of intravenous contrast. Multiplanar reconstructed images and MIPs were obtained and reviewed to evaluate the vascular anatomy. CONTRAST:  OMNIPAQUE IOHEXOL 350 MG/ML SOLN COMPARISON:  None. FINDINGS: VASCULAR Aorta: Mild atherosclerotic calcification of the abdominal aorta. No aneurysmal dilatation or dissection. No periaortic fluid collection Celiac: Patent without evidence of aneurysm, dissection, vasculitis or significant stenosis. SMA: Patent without evidence of aneurysm, dissection, vasculitis or significant stenosis. Renals: Both renal arteries are patent without evidence of aneurysm, dissection, vasculitis, fibromuscular dysplasia or significant stenosis. IMA: Patent without evidence of aneurysm, dissection, vasculitis or significant stenosis. Inflow: Patent without evidence of aneurysm, dissection, vasculitis or significant stenosis. Proximal Outflow: Bilateral common femoral and visualized portions of the superficial and profunda femoral arteries are patent without evidence of aneurysm, dissection, vasculitis or significant stenosis. Veins: No obvious venous abnormality within the limitations of this arterial phase study.  Review of the MIP images confirms the above findings. NON-VASCULAR Lower chest: The visualized lung bases are clear. There is coronary vascular calcification. No intra-abdominal free air or free fluid. Hepatobiliary: Probable mild fatty liver. No intrahepatic biliary ductal dilatation. Cholecystectomy. Pancreas: Unremarkable. No pancreatic ductal dilatation or surrounding inflammatory changes. Spleen: Normal in size without focal abnormality. Adrenals/Urinary Tract: Bilateral fat containing adrenal lesions measure up to 3 cm the left, likely myelolipoma. There is no hydronephrosis on either side. Vascular calcification versus punctate nonobstructing left renal calculi. There is a 1 cm hypodense lesion from the medial inferior pole of the left kidney which is not characterized on this CT. Ultrasound may provide better evaluation on a nonemergent/outpatient basis. The visualized ureters and urinary bladder appear unremarkable. Stomach/Bowel: There is sigmoid diverticulosis without active inflammatory changes. There is no bowel obstruction or active inflammation. The appendix is normal. Lymphatic: No adenopathy. Reproductive: The prostate and seminal vesicles are grossly unremarkable. No pelvic mass. Other: There is stranding of the subcutaneous soft tissues of the right groin. Correlation with history of recent instrumentation recommended. No fluid collection. Faint punctate extravascular density (231/4) noted suspicious for possible small active bleed or a focus of blushing. Clinical correlation and follow-up with ultrasound may provide better evaluation. Musculoskeletal: Degenerative changes of the spine. No acute osseous pathology. IMPRESSION: 1. No acute intra-abdominal or pelvic pathology. No CT evidence of mesenteric ischemia. 2. Sigmoid diverticulosis. No bowel obstruction. Normal appendix. 3. Stranding of the subcutaneous soft tissues of the right groin with possible punctate focus of contrast blushing. No  fluid collection. 4. Bilateral adrenal myelolipoma. Electronically Signed   By: Elgie Collard M.D.   On: 06/09/2020 21:08        Scheduled Meds: . isosorbide dinitrate  10 mg Oral TID  . metoprolol succinate  25 mg Oral Daily  . pantoprazole  40 mg Oral BID AC  . ranolazine  1,000 mg Oral BID  . sodium chloride flush  3 mL Intravenous Q12H  . traMADol  50 mg Oral Once   Continuous Infusions: . sodium chloride    . sodium chloride 20 mL/hr at 06/11/20 1115  . iron sucrose       LOS: 1 day    Time spent: 35 minutes.     Alba Cory, MD [facility]   If 7PM-7AM, please contact night-coverage [email or web address]  06/11/2020, 2:14 PM

## 2020-06-12 LAB — GLUCOSE, CAPILLARY
Glucose-Capillary: 135 mg/dL — ABNORMAL HIGH (ref 70–99)
Glucose-Capillary: 161 mg/dL — ABNORMAL HIGH (ref 70–99)
Glucose-Capillary: 170 mg/dL — ABNORMAL HIGH (ref 70–99)
Glucose-Capillary: 172 mg/dL — ABNORMAL HIGH (ref 70–99)
Glucose-Capillary: 175 mg/dL — ABNORMAL HIGH (ref 70–99)

## 2020-06-12 LAB — HEMOGLOBIN AND HEMATOCRIT, BLOOD
HCT: 32.2 % — ABNORMAL LOW (ref 39.0–52.0)
HCT: 37.4 % — ABNORMAL LOW (ref 39.0–52.0)
Hemoglobin: 9.4 g/dL — ABNORMAL LOW (ref 13.0–17.0)
Hemoglobin: 11.3 g/dL — ABNORMAL LOW (ref 13.0–17.0)

## 2020-06-12 MED ORDER — ACETAMINOPHEN 325 MG PO TABS: 650.0000 mg | ORAL_TABLET | Freq: Four times a day (QID) | ORAL | Status: DC | PRN

## 2020-06-12 MED ORDER — ASPIRIN 81 MG PO TBEC: 81.0000 mg | DELAYED_RELEASE_TABLET | Freq: Every day | ORAL | 11 refills | Status: DC

## 2020-06-12 MED ORDER — PANTOPRAZOLE SODIUM 40 MG PO TBEC: 40.0000 mg | DELAYED_RELEASE_TABLET | Freq: Two times a day (BID) | ORAL | 1 refills | Status: DC

## 2020-06-12 MED ORDER — TRAMADOL HCL 50 MG PO TABS
50.0000 mg | ORAL_TABLET | Freq: Two times a day (BID) | ORAL | Status: DC | PRN
  Administered 2020-06-12: 09:00:00 50 mg via ORAL
  Filled 2020-06-12 (×2): qty 1
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
## 2020-06-12 NOTE — Discharge Summary (Signed)
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 history, and epidemiological information. The expected result is Negative.  Fact Sheet for Patients:  EntrepreneurPulse.com.au  Fact Sheet for Healthcare Providers:  IncredibleEmployment.be  This test is no t yet approved or cleared by the [place] FDA and  has been authorized for detection and/or diagnosis of SARS-CoV-2 by FDA under an Emergency Use Authorization (EUA). This EUA will remain  in effect (meaning this test can be used) for the  duration of the COVID-19 declaration under Section 564(b)(1) of the Act, 21 U.S.C.section 360bbb-3(b)(1), unless the authorization is terminated  or revoked  sooner.       Influenza A by PCR NEGATIVE NEGATIVE Final   Influenza B by PCR NEGATIVE NEGATIVE Final    Comment: (NOTE) The Xpert Xpress SARS-CoV-2/FLU/RSV plus assay is intended as an aid in the diagnosis of influenza from Nasopharyngeal swab specimens and should not be used as a sole basis for treatment. Nasal washings and aspirates are unacceptable for Xpert Xpress SARS-CoV-2/FLU/RSV testing.  Fact Sheet for Patients: EntrepreneurPulse.com.au  Fact Sheet for Healthcare Providers: IncredibleEmployment.be  This test is not yet approved or cleared by the [place] FDA and has been authorized for detection and/or diagnosis of SARS-CoV-2 by FDA under an Emergency Use Authorization (EUA). This EUA will remain in effect (meaning this test can be used) for the duration of the COVID-19 declaration under Section 564(b)(1) of the Act, 21 U.S.C. section 360bbb-3(b)(1), unless the authorization is terminated or revoked.  Performed at [facility], [address] [place], [place] [address]      Time coordinating discharge: 40 minutes  SIGNED:   Elmarie Shiley, MD  [facility]

## 2020-06-13 MED ORDER — PANTOPRAZOLE SODIUM 40 MG PO TBEC: 40.0000 mg | DELAYED_RELEASE_TABLET | Freq: Two times a day (BID) | ORAL | 1 refills | Status: AC

## 2020-06-13 MED ORDER — ASPIRIN 81 MG PO TBEC: 81.0000 mg | DELAYED_RELEASE_TABLET | Freq: Every day | ORAL | 11 refills | Status: AC

## 2020-06-13 NOTE — TOC Transition Note (Addendum)
Transition of Care [facility]) - CM/SW Discharge Note   Patient Details  Name: Richard Malone MRN: [id number] Date of Birth: 05-29-1965  [facility] CM/SW Contact:  Liliana Cline, LCSW Phone Number: 06/13/2020, 6:52 AM   Clinical Narrative:   CSW was asked to call [employer] at 7 am today to get patient a ride to bus station. CSW called [employer]. They reported they have already taken this patient to the bus station. Confirmed with RN patient has already left the hospital to go to the bus station via taxi.      Barriers to Discharge: Continued Medical Work up   Patient Goals and CMS Choice Patient states their goals for this hospitalization and ongoing recovery are:: Patient is from [place] but not sure how he is going to get home      Discharge Placement                       Discharge Plan and Services   Discharge Planning Services: CM Consult            DME Arranged: N/A DME Agency: NA       HH Arranged: NA          Social Determinants of Health (SDOH) Interventions     Readmission Risk Interventions No flowsheet data found.

## 2020-06-13 NOTE — Anesthesia Postprocedure Evaluation (Signed)
Anesthesia Post Note  Patient: Henrik Orihuela  Procedure(s) Performed: ESOPHAGOGASTRODUODENOSCOPY (EGD) WITH PROPOFOL (N/A ) COLONOSCOPY WITH PROPOFOL (N/A )  Patient location during evaluation: PACU Anesthesia Type: General Level of consciousness: awake and alert Pain management: pain level controlled Vital Signs Assessment: post-procedure vital signs reviewed and stable Respiratory status: spontaneous breathing, nonlabored ventilation, respiratory function stable and patient connected to nasal cannula oxygen Cardiovascular status: blood pressure returned to baseline and stable Postop Assessment: no apparent nausea or vomiting Anesthetic complications: no   No complications documented.   Last Vitals:  Vitals:   06/12/20 2040 06/13/20 0525  BP: (!) 162/86 (!) 157/97  Pulse: 78 70  Resp: 16 16  Temp: 36.4 C 36.6 C  SpO2: 98% 99%    Last Pain:  Vitals:   06/13/20 0525  TempSrc: Oral  PainSc:                  Yevette Edwards

## 2020-06-14 ENCOUNTER — Encounter: Payer: Self-pay | Admitting: Gastroenterology

## 2020-06-14 LAB — SURGICAL PATHOLOGY
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
## 2021-08-26 IMAGING — CT CT CTA ABD/PEL W/CM AND/OR W/O CM
2 of 9 series · 14 of 46 positions shown, 16 images · IV contrast (omnipaque)
Comparison: None.

CLINICAL DATA: 54-year-old male with epigastric pain. Concern for
mesenteric ischemia.

EXAM:
CTA ABDOMEN AND PELVIS WITHOUT AND WITH CONTRAST
TECHNIQUE: Multidetector CT imaging of the abdomen and pelvis was performed
using the standard protocol during bolus administration of
intravenous contrast. Multiplanar reconstructed images and MIPs were
obtained and reviewed to evaluate the vascular anatomy.
CONTRAST:  100mL OMNIPAQUE IOHEXOL 350 MG/ML SOLN

[Series 6: axial venous · axial · portal-venous · 0.91mm/px · z∈[-700,-220]mm · 12 of 284 slices shown, 14 images]
[im 22/284  soft-tissue]
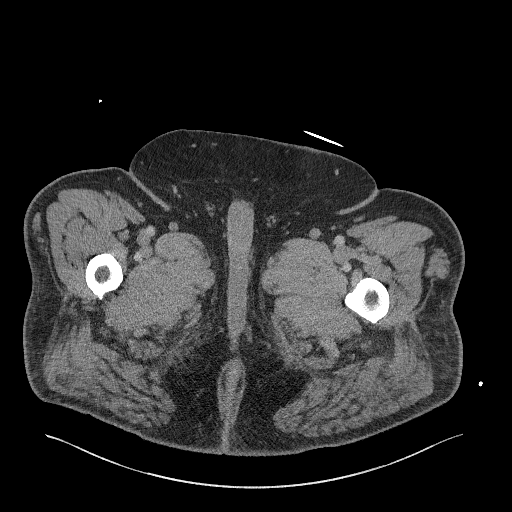
[im 22/284  bone]
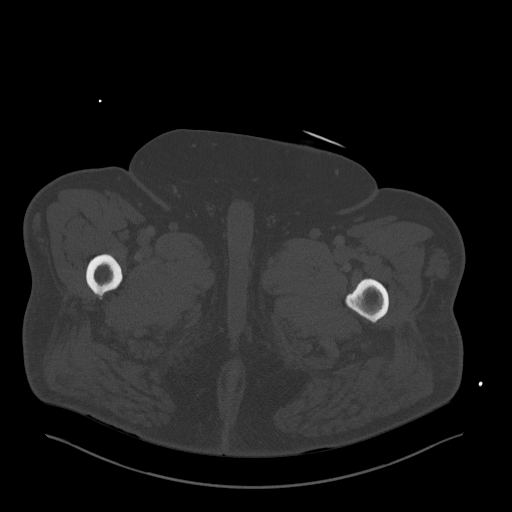
[im 44/284  soft-tissue]
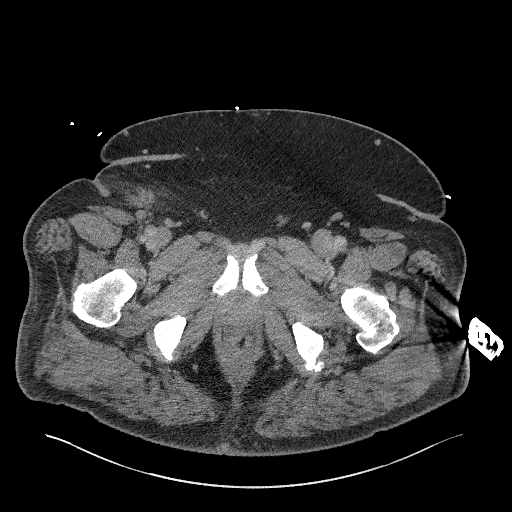
[im 66/284  soft-tissue]
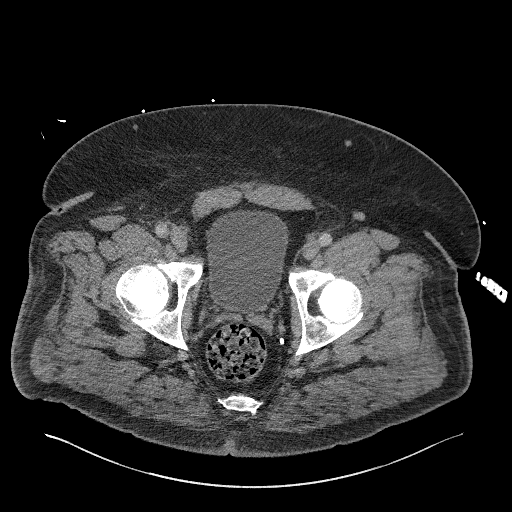
[im 88/284  soft-tissue]
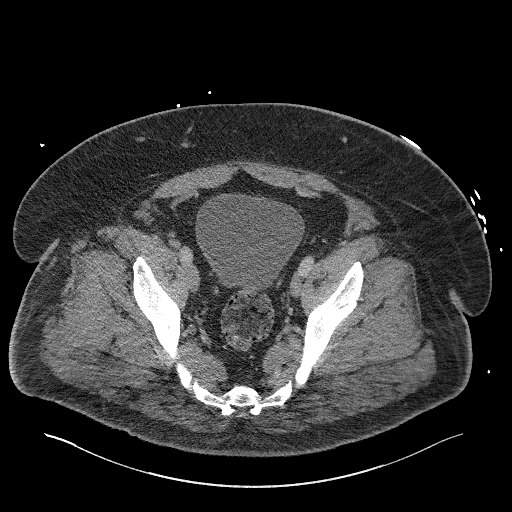
[im 109/284  soft-tissue]
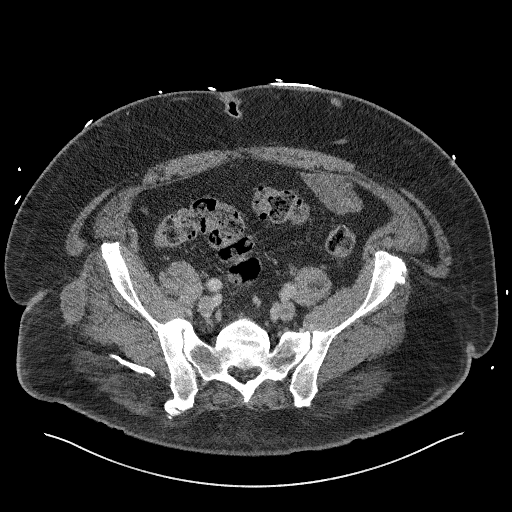
[im 131/284  soft-tissue]
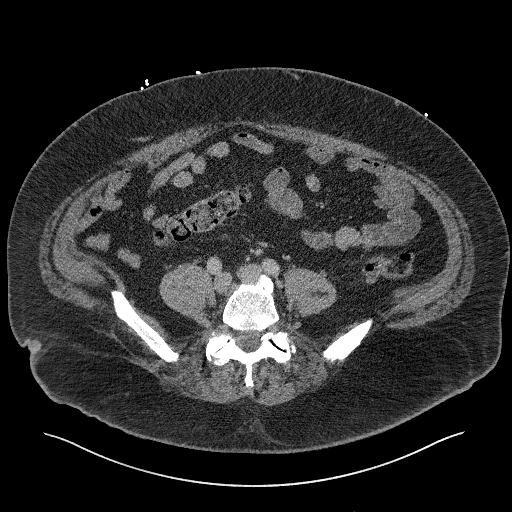
[im 153/284  soft-tissue]
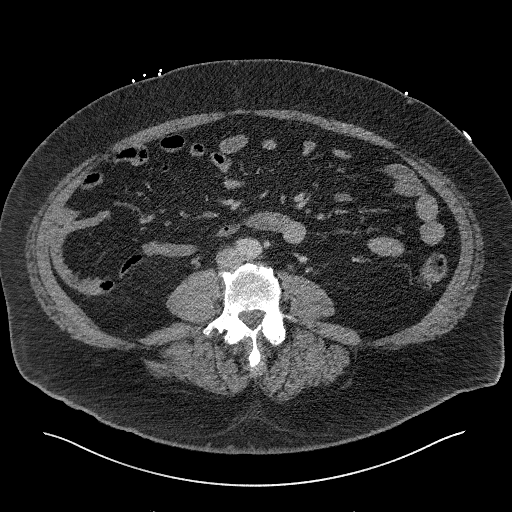
[im 175/284  soft-tissue]
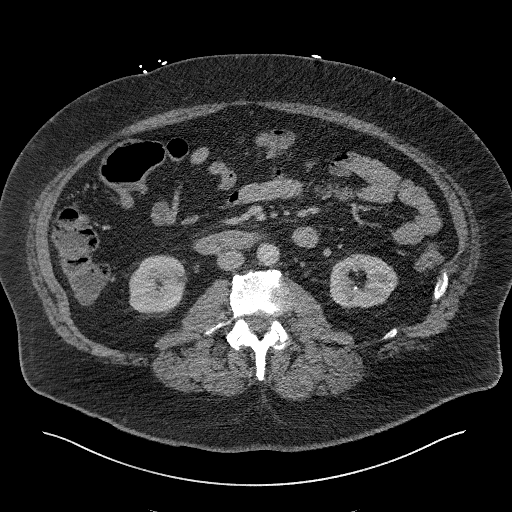
[im 196/284  soft-tissue]
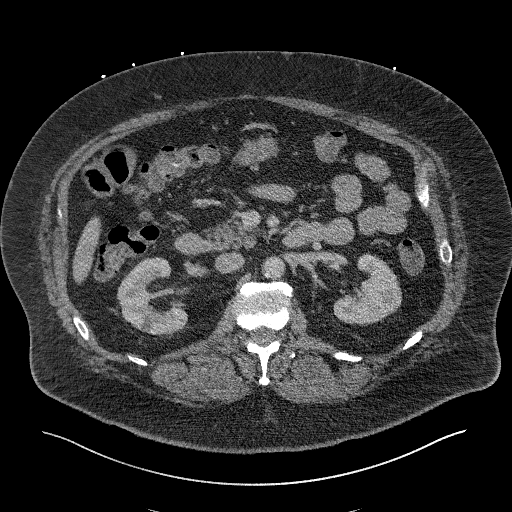
[im 196/284  bone]
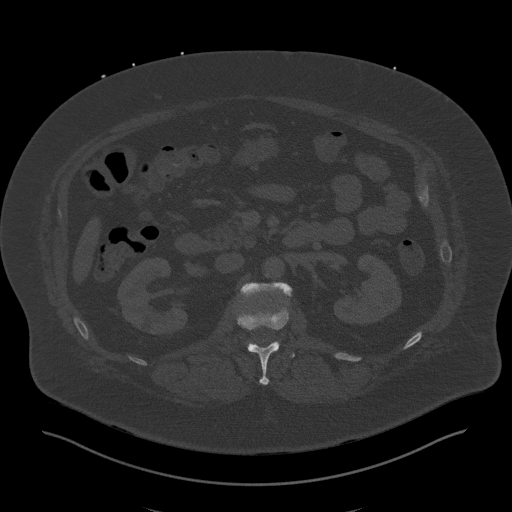
[im 218/284  soft-tissue]
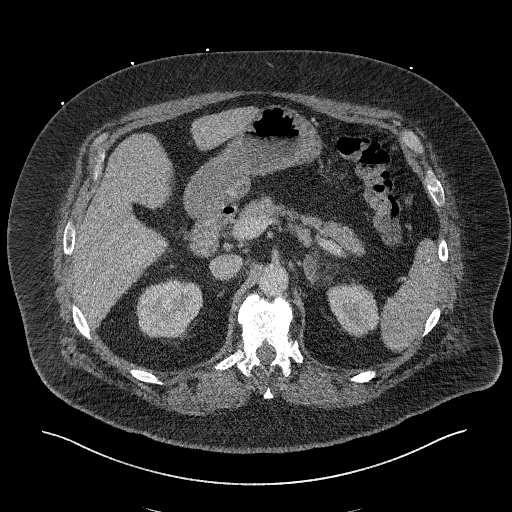
[im 240/284  soft-tissue]
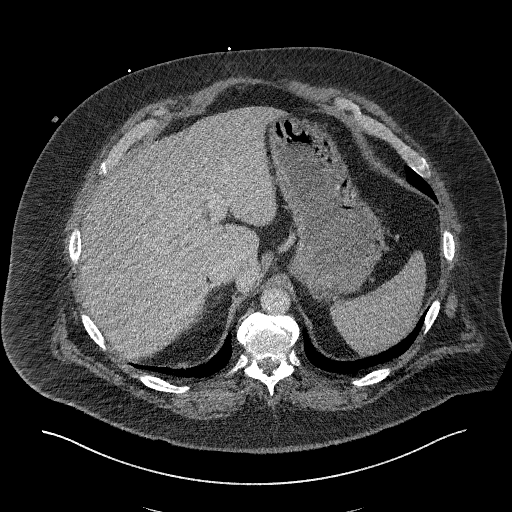
[im 262/284  soft-tissue]
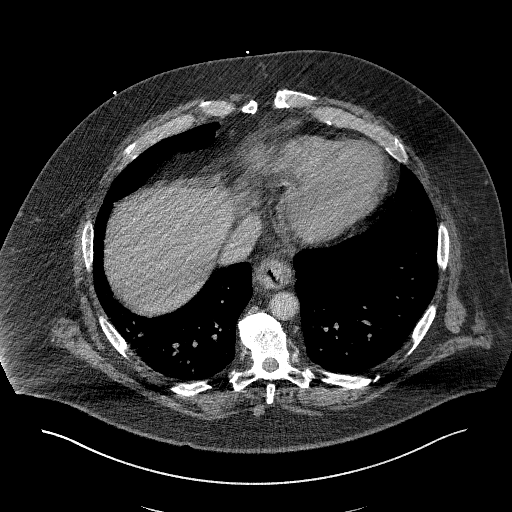

[Series 13: coronal venous mpr · coronal · portal-venous · 0.98mm/px · 2 of 189 slices shown]
[im 63/189  soft-tissue]
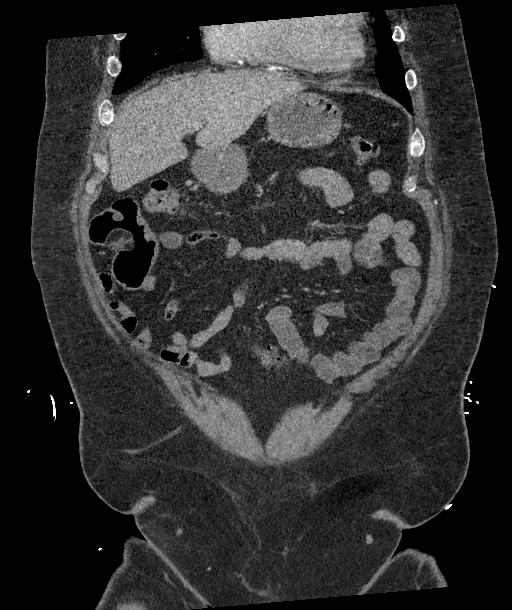
[im 126/189  soft-tissue]
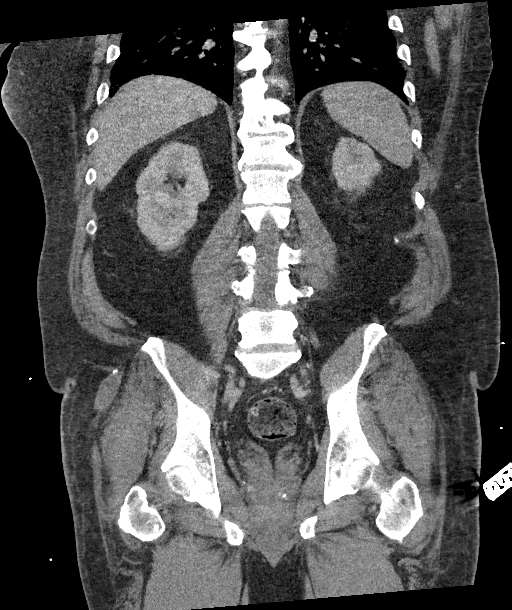

[14 of 46 positions shown; findings below may reference images not displayed]

FINDINGS: VASCULAR

Aorta: Mild atherosclerotic calcification of the abdominal aorta. No
aneurysmal dilatation or dissection. No periaortic fluid collection

Celiac: Patent without evidence of aneurysm, dissection, vasculitis
or significant stenosis.

SMA: Patent without evidence of aneurysm, dissection, vasculitis or
significant stenosis.

Renals: Both renal arteries are patent without evidence of aneurysm,
dissection, vasculitis, fibromuscular dysplasia or significant
stenosis.

IMA: Patent without evidence of aneurysm, dissection, vasculitis or
significant stenosis.

Inflow: Patent without evidence of aneurysm, dissection, vasculitis
or significant stenosis.

Proximal Outflow: Bilateral common femoral and visualized portions
of the superficial and profunda femoral arteries are patent without
evidence of aneurysm, dissection, vasculitis or significant
stenosis.

Veins: No obvious venous abnormality within the limitations of this
arterial phase study.

Review of the MIP images confirms the above findings.

NON-VASCULAR

Lower chest: The visualized lung bases are clear. There is coronary
vascular calcification.

No intra-abdominal free air or free fluid.

Hepatobiliary: Probable mild fatty liver. No intrahepatic biliary
ductal dilatation. Cholecystectomy.

Pancreas: Unremarkable. No pancreatic ductal dilatation or
surrounding inflammatory changes.

Spleen: Normal in size without focal abnormality.

Adrenals/Urinary Tract: Bilateral fat containing adrenal lesions
measure up to 3 cm the left, likely myelolipoma. There is no
hydronephrosis on either side. Vascular calcification versus
punctate nonobstructing left renal calculi. There is a 1 cm
hypodense lesion from the medial inferior pole of the left kidney
which is not characterized on this CT. Ultrasound may provide better
evaluation on a nonemergent/outpatient basis. The visualized ureters
and urinary bladder appear unremarkable.

Stomach/Bowel: There is sigmoid diverticulosis without active
inflammatory changes. There is no bowel obstruction or active
inflammation. The appendix is normal.

Lymphatic: No adenopathy.

Reproductive: The prostate and seminal vesicles are grossly
unremarkable. No pelvic mass.

Other: There is stranding of the subcutaneous soft tissues of the
right groin. Correlation with history of recent instrumentation
recommended. No fluid collection. Faint punctate extravascular
density (231/4) noted suspicious for possible small active bleed or
a focus of blushing. Clinical correlation and follow-up with
ultrasound may provide better evaluation.

Musculoskeletal: Degenerative changes of the spine. No acute osseous
pathology.
IMPRESSION: 1. No acute intra-abdominal or pelvic pathology. No CT evidence of
mesenteric ischemia.
2. Sigmoid diverticulosis. No bowel obstruction. Normal appendix.
3. Stranding of the subcutaneous soft tissues of the right groin
with possible punctate focus of contrast blushing. No fluid
collection.
4. Bilateral adrenal myelolipoma.
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
